# Patient Record
Sex: Female | Born: 1985
Health system: Southern US, Community
[De-identification: ages and names within clinical notes are randomized; demographics above are authoritative.]

## PROBLEM LIST (undated history)

## (undated) DIAGNOSIS — Z8051 Family history of malignant neoplasm of kidney: Secondary | ICD-10-CM

## (undated) DIAGNOSIS — Z808 Family history of malignant neoplasm of other organs or systems: Secondary | ICD-10-CM

## (undated) DIAGNOSIS — Z803 Family history of malignant neoplasm of breast: Secondary | ICD-10-CM

## (undated) DIAGNOSIS — C50919 Malignant neoplasm of unspecified site of unspecified female breast: Secondary | ICD-10-CM

## (undated) DIAGNOSIS — Z923 Personal history of irradiation: Secondary | ICD-10-CM

## (undated) HISTORY — DX: Family history of malignant neoplasm of breast: Z80.3

## (undated) HISTORY — DX: Family history of malignant neoplasm of kidney: Z80.51

## (undated) HISTORY — DX: Malignant neoplasm of unspecified site of unspecified female breast: C50.919

## (undated) HISTORY — PX: BREAST LUMPECTOMY: SHX2

## (undated) HISTORY — DX: Family history of malignant neoplasm of other organs or systems: Z80.8

---

## 2017-02-10 DIAGNOSIS — E559 Vitamin D deficiency, unspecified: Secondary | ICD-10-CM | POA: Insufficient documentation

## 2017-02-10 DIAGNOSIS — G4700 Insomnia, unspecified: Secondary | ICD-10-CM | POA: Insufficient documentation

## 2017-02-10 DIAGNOSIS — J3089 Other allergic rhinitis: Secondary | ICD-10-CM | POA: Insufficient documentation

## 2017-02-10 DIAGNOSIS — G43909 Migraine, unspecified, not intractable, without status migrainosus: Secondary | ICD-10-CM | POA: Insufficient documentation

## 2017-02-10 DIAGNOSIS — J302 Other seasonal allergic rhinitis: Secondary | ICD-10-CM | POA: Insufficient documentation

## 2017-03-08 DIAGNOSIS — N926 Irregular menstruation, unspecified: Secondary | ICD-10-CM | POA: Insufficient documentation

## 2017-06-20 DIAGNOSIS — Z923 Personal history of irradiation: Secondary | ICD-10-CM

## 2017-06-20 HISTORY — PX: BREAST LUMPECTOMY: SHX2

## 2017-06-20 HISTORY — DX: Personal history of irradiation: Z92.3

## 2017-10-03 DIAGNOSIS — E282 Polycystic ovarian syndrome: Secondary | ICD-10-CM | POA: Insufficient documentation

## 2018-07-30 ENCOUNTER — Telehealth: Payer: Self-pay

## 2018-07-30 ENCOUNTER — Telehealth: Payer: Self-pay | Admitting: Hematology and Oncology

## 2018-07-30 ENCOUNTER — Inpatient Hospital Stay: Payer: Commercial Managed Care - PPO | Attending: Hematology and Oncology | Admitting: Hematology and Oncology

## 2018-07-30 DIAGNOSIS — N951 Menopausal and female climacteric states: Secondary | ICD-10-CM | POA: Diagnosis not present

## 2018-07-30 DIAGNOSIS — Z1231 Encounter for screening mammogram for malignant neoplasm of breast: Secondary | ICD-10-CM

## 2018-07-30 DIAGNOSIS — Z170 Estrogen receptor positive status [ER+]: Secondary | ICD-10-CM

## 2018-07-30 DIAGNOSIS — Z79810 Long term (current) use of selective estrogen receptor modulators (SERMs): Secondary | ICD-10-CM | POA: Diagnosis not present

## 2018-07-30 DIAGNOSIS — Z923 Personal history of irradiation: Secondary | ICD-10-CM

## 2018-07-30 DIAGNOSIS — C50412 Malignant neoplasm of upper-outer quadrant of left female breast: Secondary | ICD-10-CM

## 2018-07-30 NOTE — Telephone Encounter (Signed)
Pt called to report manufacture of Tamoxifen.  Per patient, Tamoxifen package shows manufacture as "[employer]"   Pt confirmed she is taking Tamoxifen 10mg  BID.  Will forward to MD.    Letter for work successfully faxed per patient request.

## 2018-07-30 NOTE — Progress Notes (Signed)
[facility] CONSULT NOTE  Patient Care Team: Patient, No Pcp Per as PCP - General (General Practice)  CHIEF COMPLAINTS/PURPOSE OF CONSULTATION:  Newly diagnosed breast cancer in [place]  HISTORY OF PRESENTING ILLNESS:  Latiana Tomei 33 y.o. female is here because of recent diagnosis of left breast cancer in [place].  Patient has a family history of breast cancer and has been getting annual mammograms.  She missed her last year's mammogram and her parents wanted her to get another mammogram when she was visiting [place] in October.  On the mammogram there was an abnormality detected which led to a biopsy and she underwent a lumpectomy.  She did not have any lymph node involvement.  Oncotype DX was obtained which showed that she had low risk disease and she finished radiation and was started on tamoxifen.  She tells me that she has been experiencing lots of side effects from tamoxifen including profound hot flashes and emotional changes in addition to a feeling of chest pressure and fatigue and hair loss. She tells me that she has been taking 1 tablet twice a day but she does not know what dose of each tablet is.  This medication was obtained from [place].  I reviewed her records extensively and collaborated the history with the patient.  SUMMARY OF ONCOLOGIC HISTORY:   Malignant neoplasm of upper-outer quadrant of left breast in female, estrogen receptor positive ([place])   03/29/2018 Surgery    [facility] [place]: IDC grade 2, 1.8 x 1.6 x 1 cm, lymphovascular invasion present focally, 0/2 lymph nodes negative, ER positive, PR positive, HER-2 negative IHC 0, T1CN0 stage Ia pathologic stage    04/23/2018 Oncotype testing    Recurrent score: 16, distant recurrence of 9 years: 4%    05/03/2018 - 05/30/2018 Radiation Therapy    Adjuvant radiation therapy in [place] (4256 cGy/16 boost 10 Gy/4)    06/04/2018 -  Anti-estrogen oral therapy   Adjuvant tamoxifen 20 mg daily      MEDICAL HISTORY:  Other than breast cancer no other medical problems SURGICAL HISTORY: Left lumpectomy in [place] SOCIAL HISTORY: Married lives with her husband.  Does not have any children.  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 FAMILY HISTORY: Patient has family history of breast cancer.  ALLERGIES:  has no allergies on file.  MEDICATIONS: Tamoxifen REVIEW OF SYSTEMS:   Constitutional: Denies fevers, chills or abnormal night sweats Eyes: Denies blurriness of vision, double vision or watery eyes Ears, nose, mouth, throat, and face: Denies mucositis or sore throat Respiratory: Feeling of chest pressure Cardiovascular: Denies palpitation, chest discomfort or lower extremity swelling Gastrointestinal:  Denies nausea, heartburn or change in bowel habits Skin: Denies abnormal skin rashes Lymphatics: Denies new lymphadenopathy or easy bruising Neurological:Denies numbness, tingling or new weaknesses Behavioral/Psych: Mood is stable, no new changes  Breast: Intermittent mild left breast discomfort All other systems were reviewed with the patient and are negative.  PHYSICAL EXAMINATION: ECOG PERFORMANCE STATUS: 1 - Symptomatic but completely ambulatory  Vitals:   07/30/18 1239  BP: 132/80  Pulse: 81  Resp: 17  Temp: 98.6 F (37 C)  SpO2: 99%   Filed Weights   07/30/18 1239  Weight: 136 lb 1.6 oz (61.7 kg)    GENERAL:alert, no distress and comfortable SKIN: skin color, texture, turgor are normal, no rashes or significant lesions EYES: normal, conjunctiva are pink and non-injected, sclera clear OROPHARYNX:no exudate, no erythema and lips, buccal mucosa,  and tongue normal  NECK: supple, thyroid normal size, non-tender, without nodularity LYMPH:  no palpable lymphadenopathy in the cervical, axillary or inguinal LUNGS: clear to auscultation and percussion with normal breathing effort HEART: regular rate & rhythm and no  murmurs and no lower extremity edema ABDOMEN:abdomen soft, non-tender and normal bowel sounds Musculoskeletal:no cyanosis of digits and no clubbing  PSYCH: alert & oriented x 3 with fluent speech NEURO: no focal motor/sensory deficits  RADIOGRAPHIC STUDIES: I have personally reviewed the radiological reports and agreed with the findings in the report.  ASSESSMENT AND PLAN:  Malignant neoplasm of upper-outer quadrant of left breast in female, estrogen receptor positive ([place]) 03/29/2018:Left lumpectomy at [facility] [place]: IDC grade 2, 1.8 x 1.6 x 1 cm, lymphovascular invasion present focally, 0/2 lymph nodes negative, ER positive, PR positive, HER-2 negative IHC 0, T1CN0 stage Ia pathologic stage Oncotype DX score 16: Risk of recurrence 4% Adjuvant radiation therapy 05/01/2018-05/30/2018  Current treatment: Adjuvant tamoxifen started 06/03/2018 Tamoxifen toxicities: 1.  Hot flashes and night sweats 2.  Fatigue and hair loss 3.  Emotional mood swings 4.  Heaviness in the chest  Breast cancer surveillance: 1.  Breast exam 07/30/2018: Benign 2.  Mammogram will need to be done annually in October. 3.  Genetics consultation  I did not recommend routine CT scans for surveillance.  I believe that the chest heaviness that she is experiencing is stress related. I would like her to stop tamoxifen for 2 weeks and then resume it. I also wanted her to make sure that she is only taking 20 mg a day and not 20 mg twice a day. I also discussed with her that sometimes a manufacturer of tamoxifen makes a difference and that if she would like we can send her prescription for tamoxifen to her pharmacist.  Return to clinic in 3 months for survivorship care plan visit.  All questions were answered. The patient knows to call the clinic with any problems, questions or concerns.    Harriette Ohara, MD 07/30/18

## 2018-07-30 NOTE — Assessment & Plan Note (Signed)
03/29/2018:Left lumpectomy at [facility] [place]: IDC grade 2, 1.8 x 1.6 x 1 cm, lymphovascular invasion present focally, 0/2 lymph nodes negative, ER positive, PR positive, HER-2 negative IHC 0, T1CN0 stage Ia pathologic stage Oncotype DX score 16: Risk of recurrence 4% Adjuvant radiation therapy 05/01/2018-05/30/2018  Current treatment: Adjuvant tamoxifen started 06/03/2018 Tamoxifen toxicities:  Breast cancer surveillance: 1.  Breast exam 07/30/2018: Benign 2.  Mammogram will need to be done annually in October.  Return to clinic in 1 year for follow-up

## 2018-07-30 NOTE — Telephone Encounter (Signed)
Gave avs and calendar ° °

## 2018-09-03 ENCOUNTER — Other Ambulatory Visit: Payer: Self-pay | Admitting: *Deleted

## 2018-09-03 ENCOUNTER — Encounter: Payer: Self-pay | Admitting: Hematology and Oncology

## 2018-09-03 MED ORDER — TAMOXIFEN CITRATE 20 MG PO TABS: 20.0000 mg | ORAL_TABLET | Freq: Every day | ORAL | 0 refills | Status: DC

## 2018-09-03 NOTE — Telephone Encounter (Signed)
Received call from pt stated she has not heard from scheduling for her MRI Dr. Lindi Adie ordered.  According to referral prior Josem Kaufmann is incomplete.  Message sent to Gaspar Bidding to check on status of prior auth.  Pt also stated she needed a refill on her tamoxifen, pt states since being off of it for a month, she is feeling better and has less joint pain.  Prescription for tamoxifen sent to [facility] in [facility].  Pt notified and states understanding.

## 2018-10-05 ENCOUNTER — Telehealth: Payer: Self-pay | Admitting: Adult Health

## 2018-10-05 ENCOUNTER — Other Ambulatory Visit: Payer: Self-pay | Admitting: *Deleted

## 2018-10-05 ENCOUNTER — Encounter: Payer: Self-pay | Admitting: Adult Health

## 2018-10-05 DIAGNOSIS — C50412 Malignant neoplasm of upper-outer quadrant of left female breast: Secondary | ICD-10-CM

## 2018-10-05 DIAGNOSIS — Z170 Estrogen receptor positive status [ER+]: Principal | ICD-10-CM

## 2018-10-05 NOTE — Telephone Encounter (Signed)
Rescheduled SCP appt and changed to Webex per sch msg. Patient will be contacted

## 2018-10-11 ENCOUNTER — Telehealth: Payer: Self-pay | Admitting: Hematology and Oncology

## 2018-10-11 ENCOUNTER — Other Ambulatory Visit: Payer: Self-pay | Admitting: Adult Health

## 2018-10-11 ENCOUNTER — Telehealth: Payer: Self-pay | Admitting: Adult Health

## 2018-10-11 ENCOUNTER — Encounter: Payer: Self-pay | Admitting: *Deleted

## 2018-10-11 NOTE — Telephone Encounter (Signed)
Called and spoke with patient about her mammogram and MRI.  She understands why radiology needs a mammogram to help with interpreting the MRI.  She has mammogram scheduled for end of April, 2020 at the breast center.  She would like to delay MRI to October, to have a six month gap, which is reasonable. I will adjust orders accordingly.    Wilber Bihari, NP

## 2018-10-11 NOTE — Telephone Encounter (Signed)
I called and left a message for the patient to call me back.  I received a message that the patient wants to discuss with me about her mammogram and MRI.  We are happy to discuss these whenever she calls [place] back. thank you

## 2018-10-17 ENCOUNTER — Encounter: Payer: Self-pay | Admitting: Adult Health

## 2018-10-17 NOTE — Telephone Encounter (Signed)
Message left for collaborative in reference to cancellation request.

## 2018-10-17 NOTE — Telephone Encounter (Signed)
Forwarding to [employer].

## 2018-10-19 ENCOUNTER — Other Ambulatory Visit: Payer: Self-pay

## 2018-10-19 ENCOUNTER — Other Ambulatory Visit: Payer: Self-pay | Admitting: Hematology and Oncology

## 2018-10-19 ENCOUNTER — Ambulatory Visit
Admission: RE | Admit: 2018-10-19 | Discharge: 2018-10-19 | Disposition: A | Discharge status: Home / Self Care | Payer: Commercial Managed Care - PPO | Source: Ambulatory Visit | Attending: Hematology and Oncology | Admitting: Hematology and Oncology

## 2018-10-19 DIAGNOSIS — Z170 Estrogen receptor positive status [ER+]: Principal | ICD-10-CM

## 2018-10-19 DIAGNOSIS — R921 Mammographic calcification found on diagnostic imaging of breast: Secondary | ICD-10-CM

## 2018-10-19 DIAGNOSIS — C50412 Malignant neoplasm of upper-outer quadrant of left female breast: Secondary | ICD-10-CM

## 2018-10-19 HISTORY — DX: Personal history of irradiation: Z92.3

## 2018-10-19 IMAGING — MG DIGITAL DIAGNOSTIC BILATERAL MAMMOGRAM WITH TOMO AND CAD
8 of 12 series · 8 of 28 positions shown · non-contrast
Comparison: None

CLINICAL DATA: Patient with history of left breast lumpectomy [7O[place]. No prior or outside images available on
today's exam.

EXAM:
DIGITAL DIAGNOSTIC BILATERAL MAMMOGRAM WITH CAD AND TOMO

[R ML (1 of 2)]
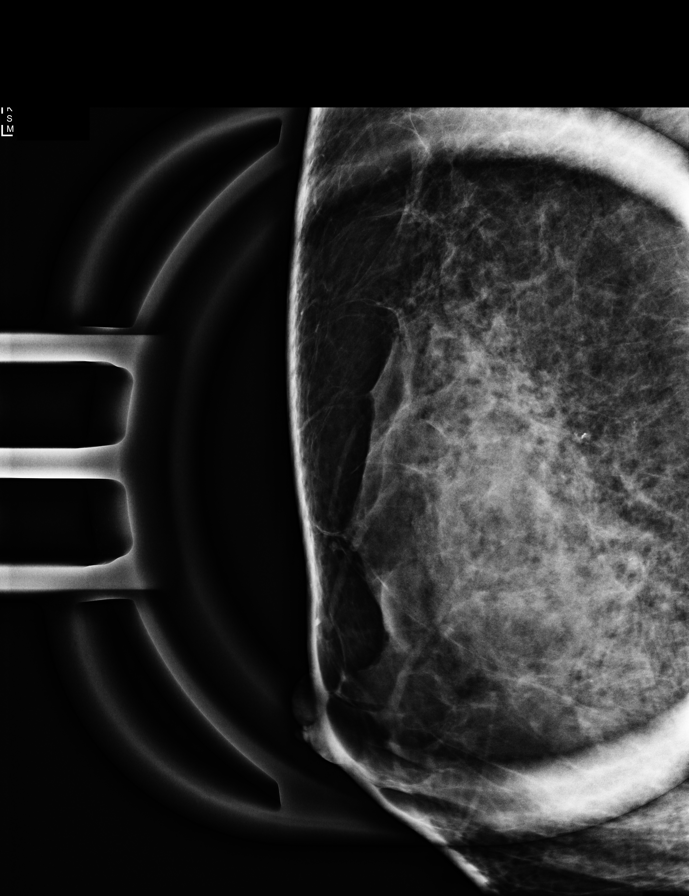

[L CC]
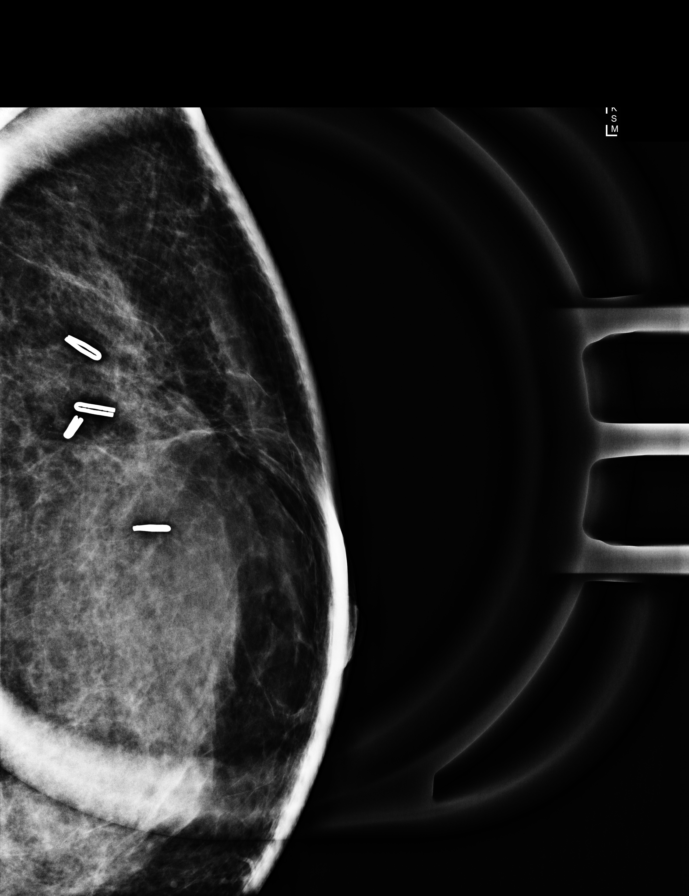

[R ML (2 of 2)]
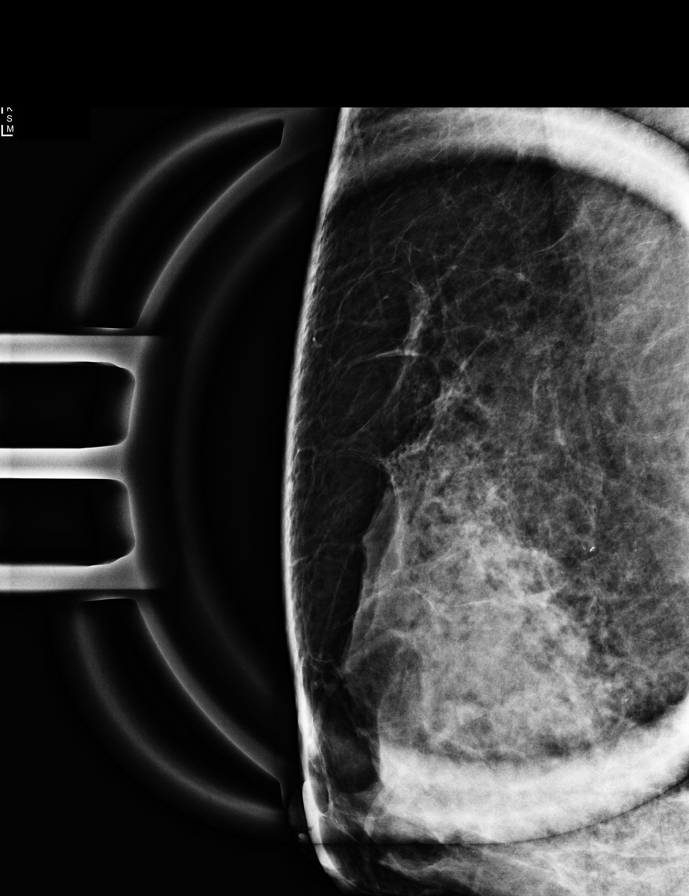

[R CC]
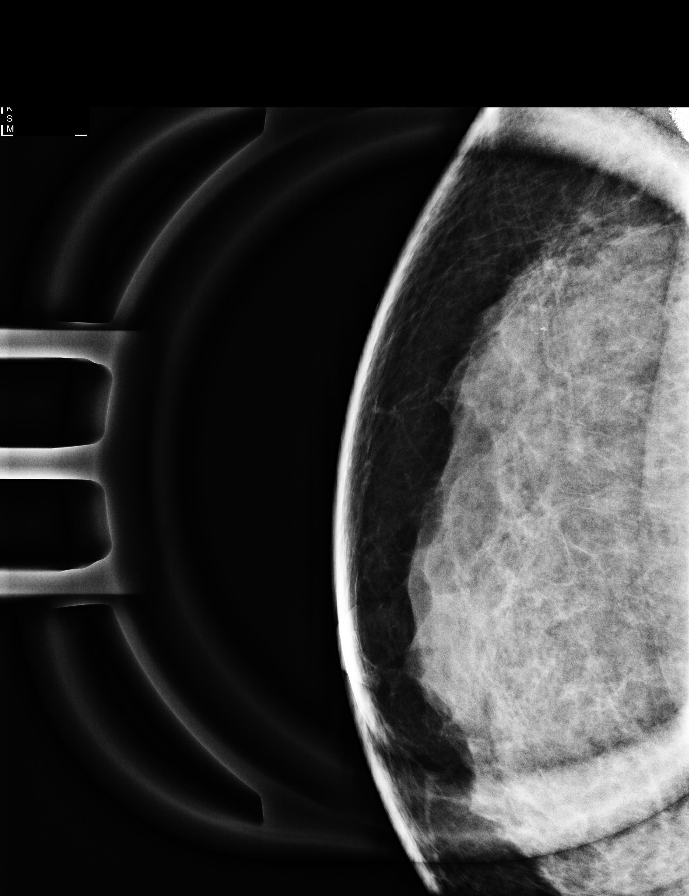

[R MLO synth-2D]
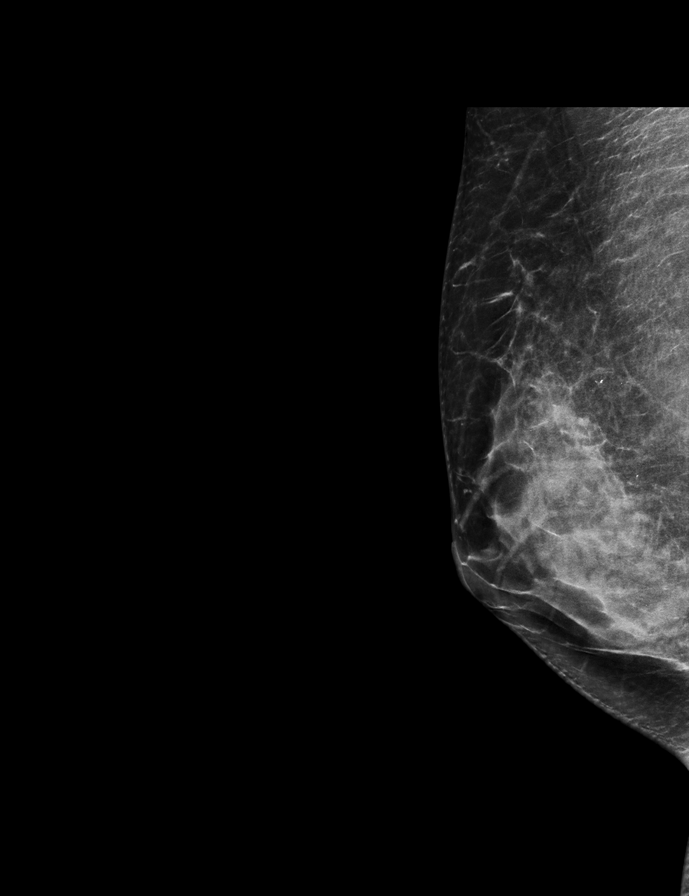

[L CC synth-2D]
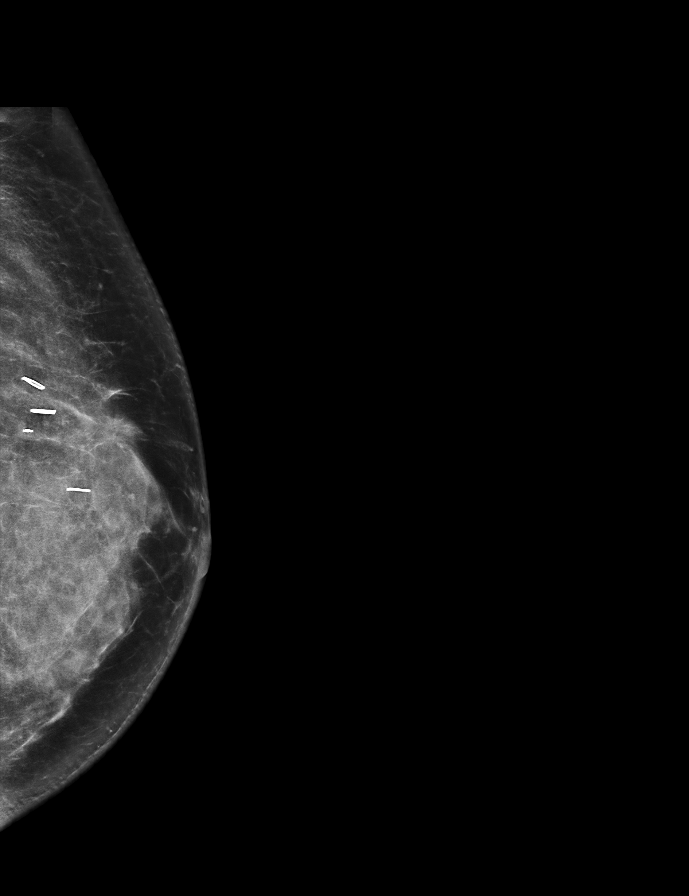

[R CC synth-2D]
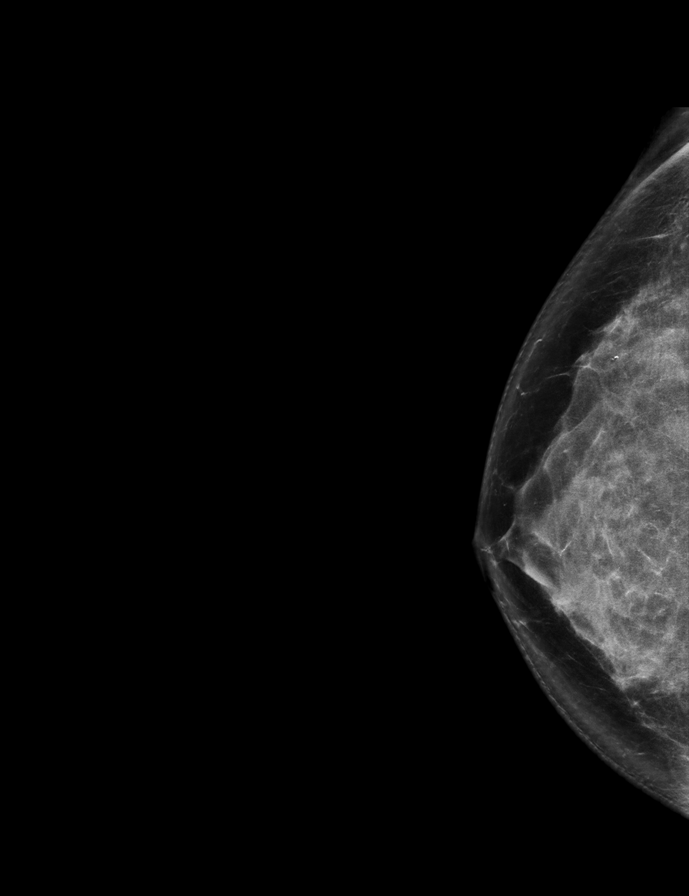

[L MLO synth-2D]
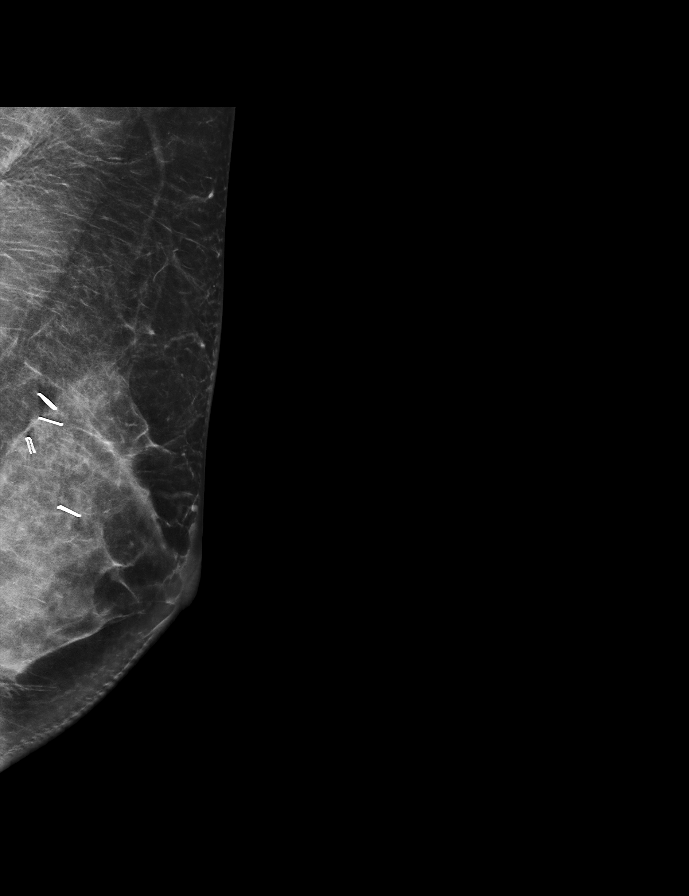

[8 of 28 positions shown; findings below may reference images not displayed]

ACR Breast Density Category d: The breast tissue is extremely dense,
which lowers the sensitivity of mammography.
FINDINGS: Post lumpectomy changes identified within the upper-outer left
breast. Within the upper-outer right breast there are likely early
dystrophic calcifications. No additional masses, calcifications or
nonsurgical distortion identified within either breast.

Mammographic images were processed with CAD.
IMPRESSION: 1. Postlumpectomy changes left breast.
2. Probably benign right breast calcifications.

RECOMMENDATION:
Bilateral diagnostic mammography with right breast magnification
views in 6 months to ensure stability of probably benign right
breast calcifications and to reassess the left breast lumpectomy
site as no priors were available for current exam. Current exam
serves as baseline.

I have discussed the findings and recommendations with the patient.
Results were also provided in writing at the conclusion of the
visit. If applicable, a reminder letter will be sent to the patient
regarding the next appointment.

BI-RADS CATEGORY  3: Probably benign.

## 2018-10-26 ENCOUNTER — Ambulatory Visit (HOSPITAL_COMMUNITY): Payer: Commercial Managed Care - PPO

## 2018-10-30 ENCOUNTER — Telehealth: Payer: Self-pay | Admitting: Adult Health

## 2018-10-30 NOTE — Telephone Encounter (Signed)
Spoke with pt and she agrees to convert office visit for 11/05/18 into a Webex mtg. Emailed step-by-step instructions how to download and access [employer].  Pt will call with any questions.

## 2018-11-05 ENCOUNTER — Encounter: Payer: Self-pay | Admitting: Adult Health

## 2018-11-05 ENCOUNTER — Inpatient Hospital Stay: Payer: Commercial Managed Care - PPO | Attending: Adult Health | Admitting: Adult Health

## 2018-11-05 DIAGNOSIS — Z1231 Encounter for screening mammogram for malignant neoplasm of breast: Secondary | ICD-10-CM

## 2018-11-05 DIAGNOSIS — Z170 Estrogen receptor positive status [ER+]: Secondary | ICD-10-CM

## 2018-11-05 DIAGNOSIS — Z79810 Long term (current) use of selective estrogen receptor modulators (SERMs): Secondary | ICD-10-CM

## 2018-11-05 DIAGNOSIS — C50412 Malignant neoplasm of upper-outer quadrant of left female breast: Secondary | ICD-10-CM

## 2018-11-05 DIAGNOSIS — Z923 Personal history of irradiation: Secondary | ICD-10-CM | POA: Diagnosis not present

## 2018-11-05 NOTE — Progress Notes (Signed)
SURVIVORSHIP VIRTUAL VISIT:  I connected with Andrea Beltran on 11/05/18 at  9:00 AM EDT by [facility] and verified that I am speaking with the correct person using two identifiers.   I discussed the limitations, risks, security and privacy concerns of performing an evaluation and management service by [facility] and the availability of in person appointments. I also discussed with the patient that there may be a patient responsible charge related to this service. The patient expressed understanding and agreed to proceed.   BRIEF ONCOLOGIC HISTORY:    Malignant neoplasm of upper-outer quadrant of left breast in female, estrogen receptor positive ([place])   03/29/2018 Surgery    Left lumpectomy at [facility] [place]: IDC grade 2, 1.8 x 1.6 x 1 cm, lymphovascular invasion present focally, 0/2 lymph nodes negative, ER positive, PR positive, HER-2 negative IHC 0, T1CN0 stage Ia pathologic stage    04/23/2018 Oncotype testing    Recurrent score: 16, distant recurrence of 9 years: 4%    05/03/2018 - 05/30/2018 Radiation Therapy    Adjuvant radiation therapy in [place] (4256 cGy/16 boost 10 Gy/4)    06/04/2018 -  Anti-estrogen oral therapy    Adjuvant tamoxifen 20 mg daily     INTERVAL HISTORY:  Andrea Beltran to review her survivorship care plan detailing her treatment course for breast cancer, as well as monitoring long-term side effects of that treatment, education regarding health maintenance, screening, and overall wellness and health promotion.     Overall, [name] reports feeling quite well.  She is taking Tamoxifen daily, but notes as the weather is getting warmer she is challenged with hot flashes.  These are interfering with her sleep and she is taking Lunesta at bedtime to help with her sleep.  She also reports an occasional chest pressure that happens once or twice a day and is brief in nature.  It is sometimes associated with a shortness of breath.  She denies any cough,  palpitations, dizziness, pleuritic chest pain with this.  She doesn't feel like her heart is racing.    [place]ina notes that her LMP was on 3/17.  She says she took a pregnancy test on 3/17 and it was negative.  She says she knows she shouldn't get pregnant on Tamoxifen, and she is not on birth control, but she and her husband are using protection.  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  Andrea Beltran is a patient of Dr. Terri Piedra at [facility].  She is returning to see them in 04/2019 for transvaginal ultrasound to monitor the uterus lining.    Tonji noted some pain in her breast after radiation that has slowly resolved.  She notes that she took some vitamin b for this and it helped.    [name] has some fatigue from time to time and is working on her diet and exercise.  She notes that she underwent mammogram in 10/2018.  She was recommended repeat in 04/2019 to follow probably benign right breast calcifications.  She has breast density category D and will undergo breast MRI at that time as well.     REVIEW OF SYSTEMS:  Review of Systems  Constitutional: Positive for fatigue. Negative for appetite change, chills, fever and unexpected weight change.  HENT:   Negative for hearing loss, lump/mass, mouth sores and trouble swallowing.   Eyes: Negative for eye problems and icterus.  Respiratory: Positive for chest tightness. Negative for cough and  shortness of breath.   Cardiovascular: Negative for chest pain, leg swelling and palpitations.  Gastrointestinal: Negative for abdominal distention, abdominal pain, constipation, diarrhea, nausea and vomiting.  Endocrine: Positive for hot flashes.  Musculoskeletal: Negative for arthralgias.  Skin: Negative for itching and rash.  Neurological: Negative for dizziness, extremity weakness, headaches, light-headedness and numbness.  Hematological: Negative for adenopathy. Does not bruise/bleed easily.   Psychiatric/Behavioral: Negative for depression. The patient is not nervous/anxious.   Breast: Denies any new nodularity, masses, tenderness, nipple changes, or nipple discharge.      ONCOLOGY TREATMENT TEAM:  1. Surgeon: In [place] 2. Medical Oncologist: Dr. Lindi Adie  3. Radiation Oncologist: In [place]    PAST MEDICAL/SURGICAL HISTORY:  Past Medical History:  Diagnosis Date  . Breast cancer ([place])   . Personal history of radiation therapy    Past Surgical History:  Procedure Laterality Date  . BREAST LUMPECTOMY       ALLERGIES:  No Known Allergies   CURRENT MEDICATIONS:  Outpatient Encounter Medications as of 11/05/2018  Medication Sig  . eszopiclone (LUNESTA) 2 MG TABS tablet Take 2 mg by mouth at bedtime as needed. Take immediately before bedtime  . fluticasone (FLONASE) 50 MCG/ACT nasal spray Place 1 spray into the nose daily.  . tamoxifen (NOLVADEX) 20 MG tablet Take 1 tablet (20 mg total) by mouth daily.   No facility-administered encounter medications on file as of 11/05/2018.      ONCOLOGIC FAMILY HISTORY:  Family History  Problem Relation Age of Onset  . Breast cancer Maternal Grandmother      GENETIC COUNSELING/TESTING: Referred today  SOCIAL HISTORY:  Social History   Socioeconomic History  . Marital status: Married    Spouse name: Not on file  . Number of children: Not on file  . Years of education: Not on file  . Highest education level: Not on file  Occupational History  . Not on file  Social Needs  . Financial resource strain: Not on file  . Food insecurity:    Worry: Not on file    Inability: Not on file  . Transportation needs:    Medical: Not on file    Non-medical: Not on file  Tobacco Use  . Smoking status: Never Smoker  . Smokeless tobacco: Never Used  Substance and Sexual Activity  . Alcohol use: Not on file  . Drug use: Not on file  . Sexual activity: Not on file  Lifestyle  . Physical activity:    Days per week: Not on  file    Minutes per session: Not on file  . Stress: Not on file  Relationships  . Social connections:    Talks on phone: Not on file    Gets together: Not on file    Attends religious service: Not on file    Active member of club or organization: Not on file    Attends meetings of clubs or organizations: Not on file    Relationship status: Not on file  . Intimate partner violence:    Fear of current or ex partner: Not on file    Emotionally abused: Not on file    Physically abused: Not on file    Forced sexual activity: Not on file  Other Topics Concern  . Not on file  Social History Narrative  . Not on file     OBSERVATIONS/OBJECTIVE:   Patient appears well.  No apparent distress.  Skin is without rash or lesion.  Breathing is non labored.  Her  mood and behavior are normal and neurologically she is focally intact.    LABORATORY DATA:  None for this visit.  DIAGNOSTIC IMAGING:      ASSESSMENT AND PLAN:  Ms.. Beltran is a pleasant 33 y.o. female with Stage IA left breast invasive ductal carcinoma, ER+/PR+/HER2-, diagnosed in 03/2018, treated with lumpectomy, adjuvant radiation therapy, and anti-estrogen therapy with Tamoxifen beginning in 12/20019.  She presents to the Survivorship Clinic for our initial meeting and routine follow-up post-completion of treatment for breast cancer.    1. Stage IA left breast cancer:  Ms. Lajeunesse is continuing to recover from definitive treatment for breast cancer. She will follow-up with her medical oncologist, Dr. Lindi Adie in 01/2018 with history and physical exam per surveillance protocol.  She will continue her anti-estrogen therapy with Tamoxifen. Thus far, she is tolerating the Tamoxifen well, with minimal side effects (see number 2). She underwent mammogram in 10/2018 and it demonstrated breast density category D.  Due to her breast density and increased risk of developing another breast cancer, she will undergo annual breast MRI as well every  November.  We also reviewed that she will need a genetics referral, which I placed today.    Today, a comprehensive survivorship care plan and treatment summary was reviewed with the patient today detailing her breast cancer diagnosis, treatment course, potential late/long-term effects of treatment, appropriate follow-up care with recommendations for the future, and patient education resources.  A copy of this summary, along with a letter will be sent to the patient's primary care provider via mail/fax/In Basket message after today's visit.    2.  Hot flashes: She and I reviewed non pharmacologic mechanisms to help relieve these hot flashes, such as avoiding hot/spicy foods, caffeine, hot beverages, ETOH.  We also reviewed that the time of day that she takes the Tamoxifen may impact whether or not she experiences hot flashes and their severity.  Right now, she is taking Lunesta for her hot flashes.  We will follow this, if they continue we can consider adding Effexor or Gabapentin to avoid her having to take a sleeping medication.    3.  Birth control: Bri[place] and I reviewed that she should be using some sort of birth control such as having an IUD placed or condoms while taking Tamoxifen.  She understands this and notes she sees her gyn regularly.    4. Chest tightness: This is brief and intermittent, question if it is anxiety related due to stress from work and breast cancer diagnosis.  She does not have a PCP in the area.  I reached out to Dr. Juleen China to see if she can get patient in as new patient and evaluate.  If not, or if there is a delay, then I will refer Srinika to cardiology for evaluation, to rule out arrhythmia.    5. Cancer screening:  Due to Ms. Cuny's history and her age, she should receive screening for skin cancers, and gynecologic cancers.  The information and recommendations are listed on the patient's comprehensive care plan/treatment summary and were reviewed in detail with the  patient.    6. Health maintenance and wellness promotion: Ms. Schwan was encouraged to consume 5-7 servings of fruits and vegetables per day. We reviewed the "Nutrition Rainbow" handout.  She was also encouraged to engage in moderate to vigorous exercise for 30 minutes per day most days of the week. We discussed the [employer] fitness program, which is designed for cancer survivors to help them become more  physically fit after cancer treatments.  She was instructed to limit her alcohol consumption and continue to abstain from tobacco use.    7. Support services/counseling: It is not uncommon for this period of the patient's cancer care trajectory to be one of many emotions and stressors.  We discussed how this can be increasingly difficult during the times of quarantine and social distancing due to the COVID-19 pandemic.   She was given information regarding our available services and encouraged to contact me with any questions or for help enrolling in any of our support group/programs.    Follow up instructions:    -Return to cancer center in 01/2019 for f/u with Dr. Lindi Adie -Establish with PCP -Breast MRI in 04/2019  -Mammogram due in 04/2019 -LTS visit with me in 07/2018 virtually -Breast exam with GYN in 07/2018  -She is welcome to return back to the Survivorship Clinic at any time; no additional follow-up needed at this time.  -Consider referral back to survivorship as a long-term survivor for continued surveillance  The patient was provided an opportunity to ask questions and all were answered. The patient agreed with the plan and demonstrated an understanding of the instructions.   The patient was advised to call back or seek an in-person evaluation if the symptoms worsen or if the condition fails to improve as anticipated.   I provided 50 minutes of face-to-face video visit time during this encounter, and > 50% was spent counseling as documented under my assessment & plan.  Scot Dock, NP

## 2018-11-06 ENCOUNTER — Telehealth: Payer: Self-pay

## 2018-11-06 ENCOUNTER — Encounter: Payer: Commercial Managed Care - PPO | Admitting: Adult Health

## 2018-11-06 NOTE — Telephone Encounter (Signed)
-----   Message from Briscoe Deutscher, DO sent at 11/05/2018  3:21 PM EDT ----- Sounds good.  ----- Message ----- From: Francella Solian, CMA Sent: 11/05/2018   2:05 PM EDT To: Briscoe Deutscher, DO  Do you want me to get app made in office so we can do EKG if needed?  ----- Message ----- From: Briscoe Deutscher, DO Sent: 11/05/2018  10:41 AM EDT To: [place]  Team: Please call the patient to offer an appointment ASAP.   Mendel Ryder, Thanks for reaching out! We will take care of her.   Hope that you are staying safe and sane! Danae Mix ----- Message ----- From: Gardenia Phlegm, NP Sent: 11/05/2018   9:41 AM EDT To: Briscoe Deutscher, DO  Tito Dine,  I hope you are well.  I had a Survivorship Care Plan Visit with Andrea Beltran today.  She is a very pleasant 33 year old woman who is healthy other than her breast cancer.   She does not have a PCP in the area, and has only been seeing her NP at her work.  She needs to establish with one.  I wanted to reach out because [name] has been having some occasional chest pressure with some associated shortness of breath.  While I think this is likely anxiety, I was hoping to get her in with a PCP to establish, and to perhaps put a holter monitor on her to avoid sending her to cardiology.  Is there any way you would be able to get her in over the next couple of weeks?  If you see her and determine this is anxiety related, typically with tamoxifen and hot flashes/anxiety effexor is our number one pick, but am happy to write for this if needed.  I hope you and your family is well!    Thanks so much,  [employer]

## 2018-11-06 NOTE — Telephone Encounter (Signed)
Called patient to make new pt app in office. L/m to call back. Can you try to get her as well. Will needs as soon as possible per wallace message.

## 2018-11-07 ENCOUNTER — Telehealth: Payer: Self-pay | Admitting: Hematology and Oncology

## 2018-11-07 NOTE — Telephone Encounter (Signed)
Scheduled genetics appt per sch msg. Called and left msg for patient

## 2018-11-08 NOTE — Progress Notes (Signed)
Virtual Visit via Video   Due to the COVID-19 pandemic, this visit was completed with telemedicine (audio/video) technology to reduce patient and provider exposure as well as to preserve personal protective equipment.  I connected with Andrea Beltran by a video enabled telemedicine application and verified that I am speaking with the correct person using two identifiers. Location patient: Home Location provider: [facility], Office Persons participating in the virtual visit: Andrea Beltran, Andrea Deutscher, DO Andrea Beltran, CMA acting as scribe for Dr. Briscoe Beltran.   I discussed the limitations of evaluation and management by telemedicine and the availability of in person appointments. The patient expressed understanding and agreed to proceed.  Care Team   Patient Care Team: Andrea Deutscher, DO as PCP - General (Family Medicine) Nicholas Lose, MD as Consulting Physician (Hematology and Oncology)  Subjective:   HPI: Patient presents for NEW PATIENT appointment.   She is taking Tamoxifen daily, but notes as the weather is getting warmer she is challenged with hot flashes.  These are interfering with her sleep and she is taking Lunesta at bedtime to help with her sleep.  She also reports an occasional chest pressure that happens once or twice a day and is brief in nature.  It is sometimes associated with a shortness of breath.  She denies any cough, palpitations, dizziness, pleuritic chest pain with this.  She doesn't feel like her heart is racing.    Andrea Beltran notes that her LMP was on 3/17.  She says she took a pregnancy test on 3/17 and it was negative.  She says she knows she shouldn't get pregnant on Tamoxifen, and she is not on birth control, but she and her husband are using protection. 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  Andrea Beltran is a patient of Dr. Terri Piedra at [facility].  She is returning to see them in 04/2019  for transvaginal ultrasound to monitor the uterus lining.    Andrea Beltran noted some pain in her breast after radiation that has slowly resolved.  She notes that she took some vitamin b for this and it helped.    Andrea Beltran has some fatigue from time to time and is working on her diet and exercise.  She notes that she underwent mammogram in 10/2018.  She was recommended repeat in 04/2019 to follow probably benign right breast calcifications.  She has breast density category D and will undergo breast MRI at that time as well.    Patient states that she has had increased "tightnes" in chest. She denies any chest pain just pressure when she tries to take a deep breath. She states that it is worse when she lying flat. She does have issues with allergies. She is taking Flonase. She denies any issues with reflux and heart burn. When she has they symptoms they last for a while and will go away. She has noticed that when she had stopped the tamoxifen it was better. Symptoms were bad when she started back on tamoxifen but as she gets adjusted to it symptoms have improved.   Sleep: she has had increased problems with sleeping. She has been on Lunesta for a wile. It does help with going to sleep but does not help her stay asleep.    She does intermittent diet and exercises at minimum 3 times a week.   Patient Active Problem List   Diagnosis Date Noted  . Family history of breast cancer   .  Family history of kidney cancer   . Family history of brain cancer   . Hot flashes due to tamoxifen 11/09/2018  . Malignant neoplasm of upper-outer quadrant of left breast in female, estrogen receptor positive ([place]) 07/30/2018  . Seasonal allergic rhinitis 02/10/2017  . Insomnia 02/10/2017  . Vitamin D deficiency 02/10/2017    Social History   Tobacco Use  . Smoking status: Never Smoker  . Smokeless tobacco: Never Used  Substance Use Topics  . Alcohol use: Not on file   Current Outpatient Medications:  .  eszopiclone  (LUNESTA) 2 MG TABS tablet, Take 2 mg by mouth at bedtime as needed. Take immediately before bedtime, Disp: , Rfl:  .  fluticasone (FLONASE) 50 MCG/ACT nasal spray, Place 1 spray into the nose daily., Disp: , Rfl:  .  tamoxifen (NOLVADEX) 20 MG tablet, Take 1 tablet (20 mg total) by mouth daily., Disp: 90 tablet, Rfl: 0  Allergies  Allergen Reactions  . Cat Hair Extract Other (See Comments)    sneezing sneezing   . Pollen Extract Other (See Comments)    Sneezing     Objective:   VITALS: Per patient if applicable, see vitals. GENERAL: Alert and in no acute distress. CARDIOPULMONARY: No increased WOB. Speaking in clear sentences.  PSYCH: Pleasant and cooperative. Speech normal rate and rhythm. Affect is appropriate. Insight and judgement are appropriate. Attention is focused, linear, and appropriate.  NEURO: Oriented as arrived to appointment on time with no prompting.   Assessment and Plan:   Andrea Beltran was seen today for establish care.  Diagnoses and all orders for this visit:  Atypical chest pain Comments: Labs, EKG, CXR in office this week. Tamxifen has increased pain per patient. Will see if Effexor helps.  Vitamin D deficiency  Primary insomnia  Seasonal allergic rhinitis due to pollen, cats  Malignant neoplasm of upper-outer quadrant of left breast in female, estrogen receptor positive ([place])  Hot flashes due to tamoxifen Comments: Taking calcium and vitamin D. Orders: -     venlafaxine XR (EFFEXOR XR) 37.5 MG 24 hr capsule; Take 1 capsule (37.5 mg total) by mouth daily with breakfast. (Patient not taking: Reported on 11/22/2018)    . COVID-19 Education: The signs and symptoms of COVID-19 were discussed with the patient and how to seek care for testing if needed. The importance of social distancing was discussed today. . Reviewed expectations re: course of current medical issues. . Discussed self-management of symptoms. . Outlined signs and symptoms indicating need  for more acute intervention. . Patient verbalized understanding and all questions were answered. Marland Kitchen Health Maintenance issues including appropriate healthy diet, exercise, and smoking avoidance were discussed with patient. . See orders for this visit as documented in the electronic medical record.  Andrea Deutscher, DO  Records requested if needed. Time spent: 30 minutes, of which >50% was spent in obtaining information about her symptoms, reviewing her previous labs, evaluations, and treatments, counseling her about her condition (please see the discussed topics above), and developing a plan to further investigate it; she had a number of questions which I addressed.

## 2018-11-09 ENCOUNTER — Encounter: Payer: Self-pay | Admitting: Family Medicine

## 2018-11-09 ENCOUNTER — Ambulatory Visit (INDEPENDENT_AMBULATORY_CARE_PROVIDER_SITE_OTHER): Payer: Commercial Managed Care - PPO | Admitting: Family Medicine

## 2018-11-09 ENCOUNTER — Other Ambulatory Visit: Payer: Self-pay

## 2018-11-09 VITALS — Ht 66.0 in | Wt 136.0 lb

## 2018-11-09 DIAGNOSIS — Z170 Estrogen receptor positive status [ER+]: Secondary | ICD-10-CM

## 2018-11-09 DIAGNOSIS — F5101 Primary insomnia: Secondary | ICD-10-CM | POA: Diagnosis not present

## 2018-11-09 DIAGNOSIS — C50412 Malignant neoplasm of upper-outer quadrant of left female breast: Secondary | ICD-10-CM

## 2018-11-09 DIAGNOSIS — R0789 Other chest pain: Secondary | ICD-10-CM | POA: Diagnosis not present

## 2018-11-09 DIAGNOSIS — R232 Flushing: Secondary | ICD-10-CM | POA: Insufficient documentation

## 2018-11-09 DIAGNOSIS — E559 Vitamin D deficiency, unspecified: Secondary | ICD-10-CM | POA: Diagnosis not present

## 2018-11-09 DIAGNOSIS — J301 Allergic rhinitis due to pollen: Secondary | ICD-10-CM | POA: Diagnosis not present

## 2018-11-09 DIAGNOSIS — T451X5A Adverse effect of antineoplastic and immunosuppressive drugs, initial encounter: Secondary | ICD-10-CM

## 2018-11-09 MED ORDER — VENLAFAXINE HCL ER 37.5 MG PO CP24: 37.5000 mg | ORAL_CAPSULE | Freq: Every day | ORAL | 0 refills | Status: DC

## 2018-11-14 ENCOUNTER — Telehealth: Payer: Self-pay | Admitting: Licensed Clinical Social Worker

## 2018-11-14 NOTE — Telephone Encounter (Signed)
Called patient regarding upcoming Webex appointment, patient is notified and e-mail has been sent. °

## 2018-11-15 ENCOUNTER — Ambulatory Visit: Payer: Commercial Managed Care - PPO | Admitting: Family Medicine

## 2018-11-19 ENCOUNTER — Inpatient Hospital Stay: Payer: Commercial Managed Care - PPO | Attending: Adult Health | Admitting: Licensed Clinical Social Worker

## 2018-11-19 DIAGNOSIS — C50412 Malignant neoplasm of upper-outer quadrant of left female breast: Secondary | ICD-10-CM

## 2018-11-19 DIAGNOSIS — Z1379 Encounter for other screening for genetic and chromosomal anomalies: Secondary | ICD-10-CM

## 2018-11-19 DIAGNOSIS — Z170 Estrogen receptor positive status [ER+]: Secondary | ICD-10-CM

## 2018-11-19 DIAGNOSIS — Z803 Family history of malignant neoplasm of breast: Secondary | ICD-10-CM | POA: Diagnosis not present

## 2018-11-19 DIAGNOSIS — Z8051 Family history of malignant neoplasm of kidney: Secondary | ICD-10-CM

## 2018-11-19 DIAGNOSIS — Z808 Family history of malignant neoplasm of other organs or systems: Secondary | ICD-10-CM

## 2018-11-20 ENCOUNTER — Ambulatory Visit: Payer: Commercial Managed Care - PPO | Admitting: Family Medicine

## 2018-11-20 ENCOUNTER — Encounter: Payer: Self-pay | Admitting: Licensed Clinical Social Worker

## 2018-11-20 DIAGNOSIS — Z803 Family history of malignant neoplasm of breast: Secondary | ICD-10-CM | POA: Insufficient documentation

## 2018-11-20 DIAGNOSIS — Z8051 Family history of malignant neoplasm of kidney: Secondary | ICD-10-CM | POA: Insufficient documentation

## 2018-11-20 DIAGNOSIS — Z808 Family history of malignant neoplasm of other organs or systems: Secondary | ICD-10-CM | POA: Insufficient documentation

## 2018-11-20 NOTE — Progress Notes (Signed)
REFERRING PROVIDER: Nicholas Lose, MD [place], [place] [address]  PRIMARY PROVIDER:  Briscoe Deutscher, DO  PRIMARY REASON FOR VISIT:  1. Malignant neoplasm of upper-outer quadrant of left breast in female, estrogen receptor positive ([place])   2. Family history of breast cancer   3. Family history of kidney cancer   4. Family history of brain cancer    I connected with Andrea Beltran on 11/19/2018 at 4:00 PM EDT by Webex and verified that I am speaking with the correct person using two identifiers.    Patient location: home Provider location: clinic  HISTORY OF PRESENT ILLNESS:   Andrea Beltran, a 33 y.o. female, was seen for a [facility] cancer genetics consultation at the request of Dr. Lindi Adie due to a personal and family history of breast cancer.  Andrea Beltran presents to clinic today to discuss the possibility of a hereditary predisposition to cancer, genetic testing, and to further clarify her future cancer risks, as well as potential cancer risks for family members.   In 2019, at the age of 38, Andrea Beltran was diagnosed with invasive ductal carcinoma of the left breast in [place], ER+ PR+ Her2-. This was treated with lumpectomy and radiation, and she takes tamoxifen.   CANCER HISTORY:    Malignant neoplasm of upper-outer quadrant of left breast in female, estrogen receptor positive ([place])   03/29/2018 Surgery    Left lumpectomy at [facility] [place]: IDC grade 2, 1.8 x 1.6 x 1 cm, lymphovascular invasion present focally, 0/2 lymph nodes negative, ER positive, PR positive, HER-2 negative IHC 0, T1CN0 stage Ia pathologic stage    04/23/2018 Oncotype testing    Recurrent score: 16, distant recurrence of 9 years: 4%    05/03/2018 - 05/30/2018 Radiation Therapy    Adjuvant radiation therapy in [place] (4256 cGy/16 boost 10 Gy/4)    06/04/2018 -  Anti-estrogen oral therapy    Adjuvant tamoxifen 20 mg daily     RISK FACTORS:  Menarche was at age 45.   First live birth at age no children.  OCP use for approximately  1 month Ovaries intact: Yes  Hysterectomy: No  Menopausal status: Premenopausal  HRT use: 0 years. Colonoscopy: No not examined. Mammogram within the last year: Yes Up to date with pelvic exams: Yes Any excessive radiation exposure in the past: No  Past Medical History:  Diagnosis Date  . Breast cancer ([place])   . Family history of brain cancer   . Family history of breast cancer   . Family history of kidney cancer   . Personal history of radiation therapy     Past Surgical History:  Procedure Laterality Date  . BREAST LUMPECTOMY      Social History   Socioeconomic History  . Marital status: Married    Spouse name: Not on file  . Number of children: Not on file  . Years of education: Not on file  . Highest education level: Not on file  Occupational History  . Not on file  Social Needs  . Financial resource strain: Not on file  . Food insecurity:    Worry: Not on file    Inability: Not on file  . Transportation needs:    Medical: Not on file    Non-medical: Not on file  Tobacco Use  . Smoking status: Never Smoker  . Smokeless tobacco: Never Used  Substance and Sexual Activity  . Alcohol use: Not on file  . Drug use: Not on  file  . Sexual activity: Not on file  Lifestyle  . Physical activity:    Days per week: Not on file    Minutes per session: Not on file  . Stress: Not on file  Relationships  . Social connections:    Talks on phone: Not on file    Gets together: Not on file    Attends religious service: Not on file    Active member of club or organization: Not on file    Attends meetings of clubs or organizations: Not on file    Relationship status: Not on file  Other Topics Concern  . Not on file  Social History Narrative  . Not on file     FAMILY HISTORY:  We obtained a detailed, 4-generation family history.  Significant diagnoses are listed below: Family History  Problem  Relation Age of Onset  . Breast cancer Maternal Grandmother    Andrea Beltran does not have children. She has one sister, age 70, no cancer history.  Andrea Beltran mother is living in her 60s, no cancer. The patient has 3 paternal uncles, in their 63s, no cancers. No known cancers in her maternal cousins. Her maternal grandmother had breast cancer diagnosed around the age of 38 and has also had skin and kidney cancer. She is living at 1. Patient's maternal grandfather is living at 12.  Andrea Beltran father is living in his 71s, no cancer. The patient has 2 paternal uncles and 1 paternal aunt. One of her uncles had brain cancer. No known cancers in paternal cousins. Her paternal grandparents are both living in their 44s.   Andrea Beltran is unaware of previous family history of genetic testing for hereditary cancer risks. Patient's maternal and paternal ancestry is [place]. There is no reported Ashkenazi Jewish ancestry. There is no known consanguinity.  GENETIC COUNSELING ASSESSMENT: Andrea Beltran is a 33 y.o. female with a personal and family history which is somewhat suggestive of a hereditary cancer syndrome and predisposition to cancer. We, therefore, discussed and recommended the following at today's visit.   DISCUSSION: We discussed that 5-10% of breast cancer is hereditary, with most cases associated with the BRCA1/2 genes. There are other genes that can be associated with hereditary breast cancer syndromes.  These include PALB2, ATM, CHEK2, etc.  We reviewed the characteristics, features and inheritance patterns of hereditary cancer syndromes. We also discussed genetic testing, including the appropriate family members to test, the process of testing, insurance coverage and turn-around-time for results. We discussed the implications of a negative, positive and/or variant of uncertain significant result. We recommended Andrea Beltran pursue genetic testing for the [facility] Common Hereditary Cancers + Brain + Renal gene  panel.   The Common Hereditary Cancers Panel offered by [facility] includes sequencing and/or deletion duplication testing of the following 48 genes: APC, ATM, AXIN2, BARD1, BMPR1A, BRCA1, BRCA2, BRIP1, CDH1, CDKN2A (p14ARF), CDKN2A (p16INK4a), CKD4, CHEK2, CTNNA1, DICER1, EPCAM (Deletion/duplication testing only), GREM1 (promoter region deletion/duplication testing only), KIT, MEN1, MLH1, MSH2, MSH3, MSH6, MUTYH, NBN, NF1, NHTL1, PALB2, PDGFRA, PMS2, POLD1, POLE, PTEN, RAD50, RAD51C, RAD51D, RNF43, SDHB, SDHC, SDHD, SMAD4, SMARCA4. STK11, TP53, TSC1, TSC2, and VHL.  The following genes were evaluated for sequence changes only: SDHA and HOXB13 c.251G>A variant only.  The CNS/Brain Cancer Panel offered by [facility] includes sequencing and/or deletion duplication testing of the following 42 genes: ALK, APC, BAP1, BARD1, CDK4, CDKN2A, DICER1, EPCAM, EZH2, GPC3, HRAS, KIF1B, MEN1, MLH1, MSH2, MSH6, NF1, NF2, PHOX2B, PMS2, POT1, PRKAR1A, PTCH2, PTEN,  RB1, SMARCA4, SMARCB1, SMARCE1, SUFU, TP53, TSC1, TSC2, and VHL.   The [facility] Renal/Urinary Tract Cancers Panel analyzes the following 24 genes:BAP1 ,CDC73, CDKN1C, DICER1, DIS3L2, EPCAM, FH, FLCN, GPC3, MET, MLH1, MSH2, MSH6, PMS2, PTEN, SDHB, SDHC, SMARCA4, SMARCB1, TP53, TSC1, TSC2, VHL, WT1.  Based on Andrea Beltran's personal and family history of cancer, she meets medical criteria for genetic testing. Despite that she meets criteria, she may still have an out of pocket cost. The lab will notify her of an OOP if any.  PLAN: After considering the risks, benefits, and limitations, Andrea Beltran provided informed consent to pursue genetic testing. A saliva kit was mailed to her home and she will mail her sample to [facility] for analysis of the Common Hereditary Cancers Panel. Results should be available within approximately 2-3 weeks' time, at which point they will be disclosed by telephone to Andrea Beltran, as will any additional recommendations warranted by these  results. Andrea Beltran will receive a summary of her genetic counseling visit and a copy of her results once available. This information will also be available in Epic.   Lastly, we encouraged Andrea Beltran to remain in contact with cancer genetics annually so that we can continuously update the family history and inform her of any changes in cancer genetics and testing that may be of benefit for this family.   Andrea Beltran questions were answered to her satisfaction today. Our contact information was provided should additional questions or concerns arise. Thank you for the referral and allowing [place] to share in the care of your patient.   Faith Rogue, MS Genetic Counselor [email or web address] Phone: [phone]  The patient was seen for a total of 25 minutes in virtual genetic counseling.  Drs. Magrinat, Lindi Adie and/or Burr Medico were available for discussion regarding this case.

## 2018-11-21 ENCOUNTER — Ambulatory Visit (INDEPENDENT_AMBULATORY_CARE_PROVIDER_SITE_OTHER): Payer: Commercial Managed Care - PPO

## 2018-11-21 ENCOUNTER — Other Ambulatory Visit: Payer: Self-pay

## 2018-11-21 ENCOUNTER — Ambulatory Visit: Payer: Commercial Managed Care - PPO | Admitting: Family Medicine

## 2018-11-21 VITALS — BP 110/62 | HR 82 | Temp 98.0°F | Wt 135.0 lb

## 2018-11-21 DIAGNOSIS — Z803 Family history of malignant neoplasm of breast: Secondary | ICD-10-CM

## 2018-11-21 DIAGNOSIS — T451X5A Adverse effect of antineoplastic and immunosuppressive drugs, initial encounter: Secondary | ICD-10-CM

## 2018-11-21 DIAGNOSIS — R0789 Other chest pain: Secondary | ICD-10-CM

## 2018-11-21 DIAGNOSIS — R232 Flushing: Secondary | ICD-10-CM

## 2018-11-21 LAB — POCT URINE PREGNANCY: Preg Test, Ur: NEGATIVE

## 2018-11-21 IMAGING — DX CHEST - 2 VIEW
2 series · 2 of 2 positions shown · non-contrast
Comparison: None.

CLINICAL DATA: Family history of breast and renal carcinoma.

EXAM:
CHEST - 2 VIEW

[chest pa]
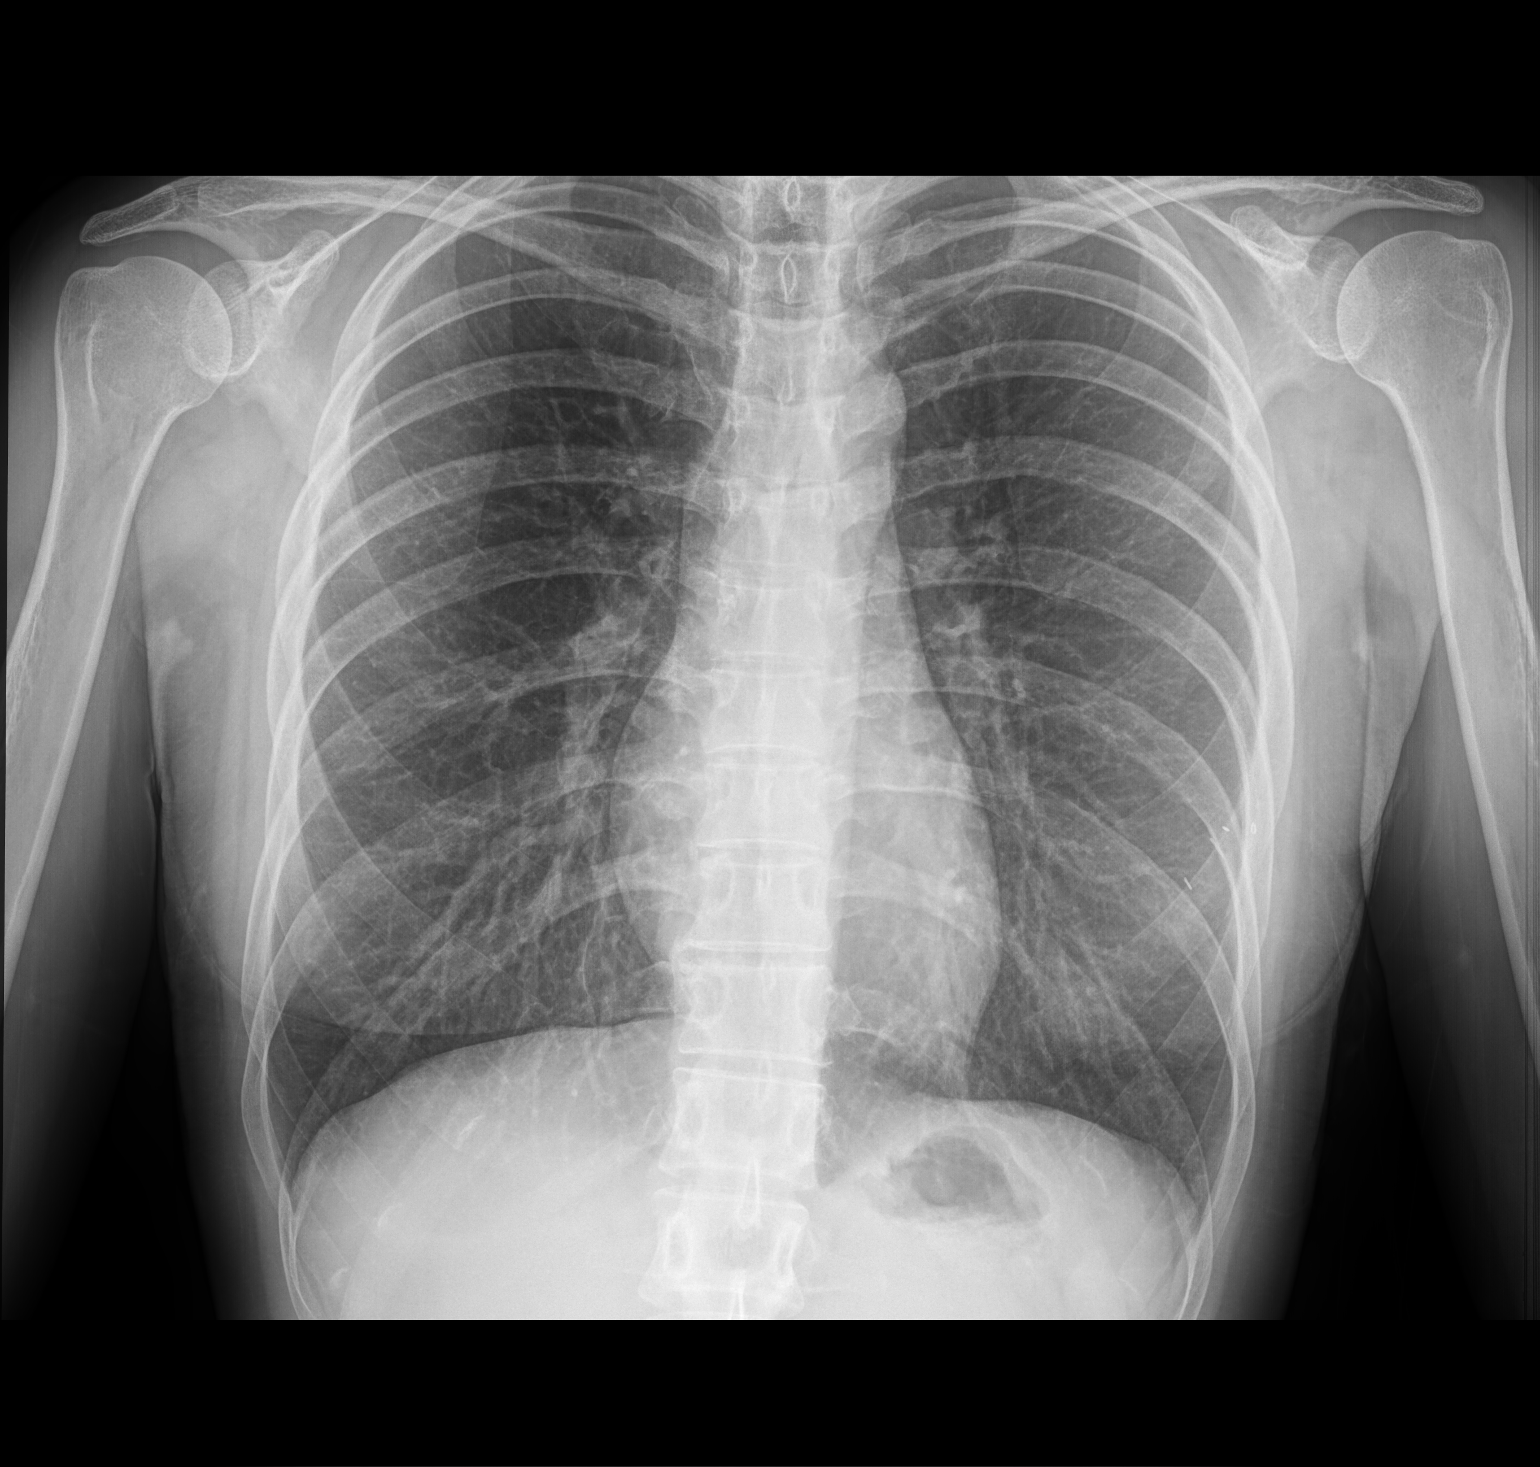

[chest lat]
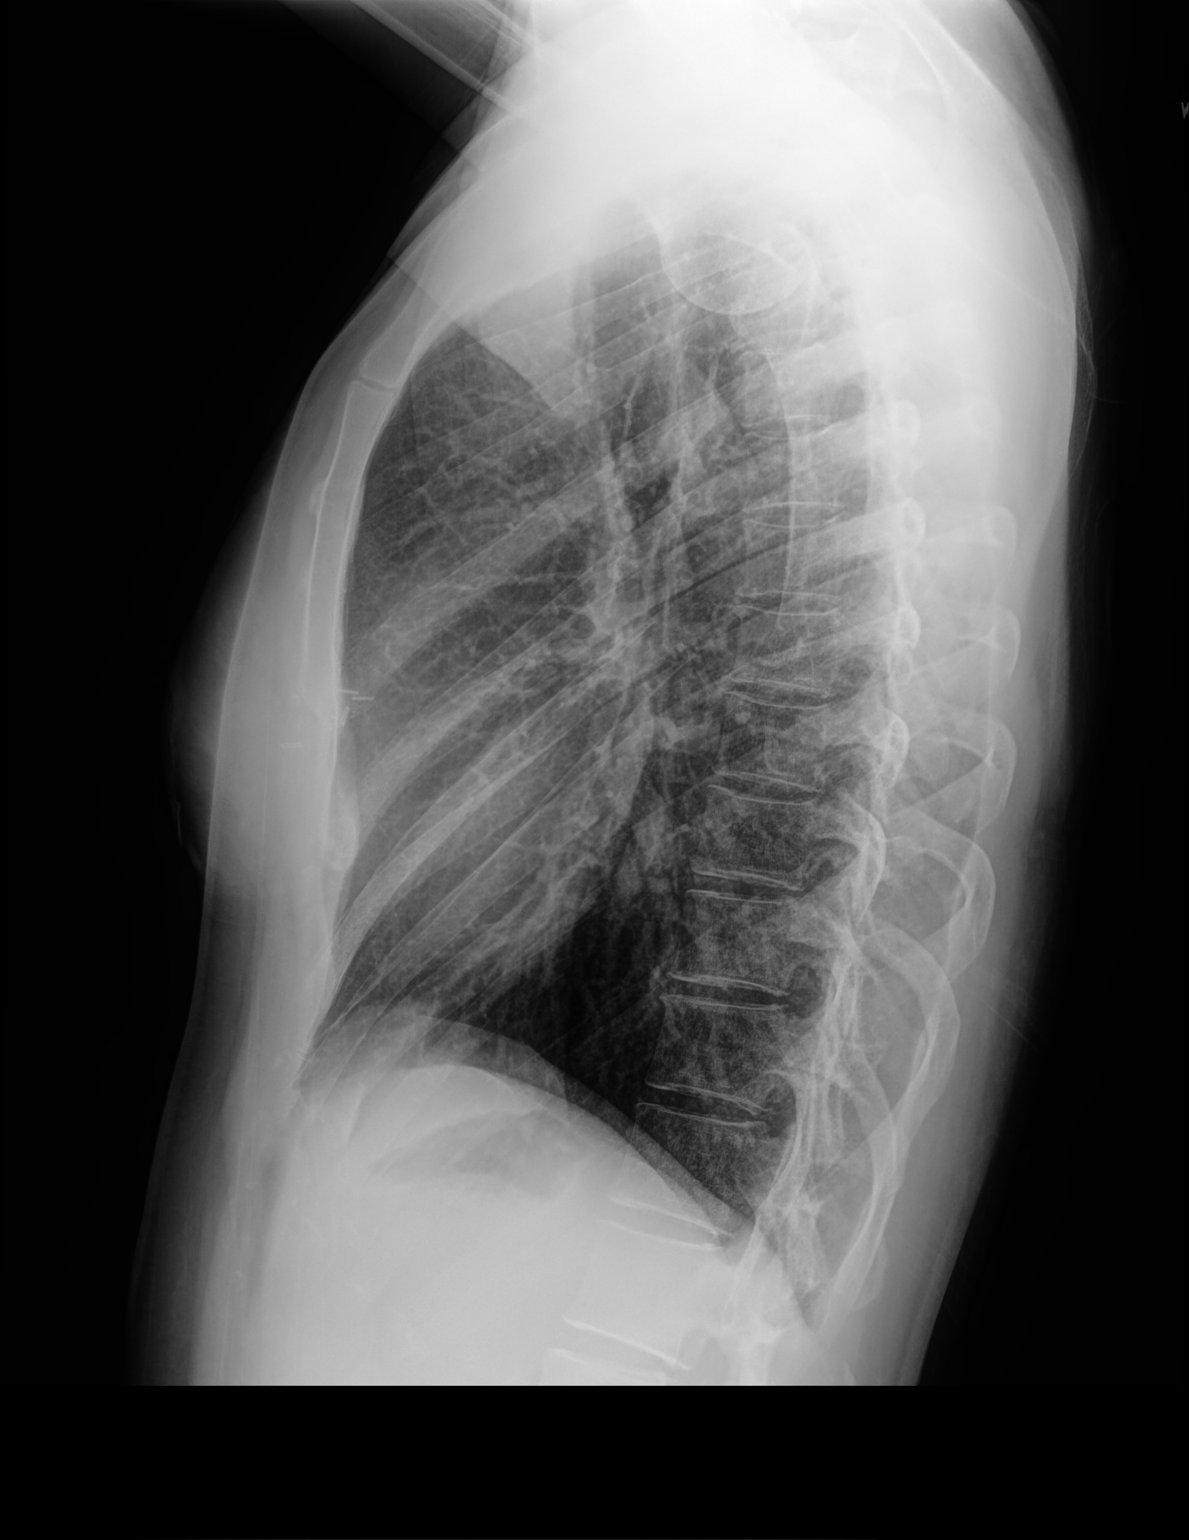

[2 of 2 positions shown; findings below may reference images not displayed]

FINDINGS: Lungs are clear. Heart size and pulmonary vascularity are normal. No
adenopathy. No bone lesions. Are surgical clips in the left breast
region.
IMPRESSION: No edema or consolidation.  No evident adenopathy.

## 2018-11-22 ENCOUNTER — Encounter: Payer: Self-pay | Admitting: Family Medicine

## 2018-11-22 ENCOUNTER — Ambulatory Visit (INDEPENDENT_AMBULATORY_CARE_PROVIDER_SITE_OTHER): Payer: Commercial Managed Care - PPO | Admitting: Family Medicine

## 2018-11-22 DIAGNOSIS — C50412 Malignant neoplasm of upper-outer quadrant of left female breast: Secondary | ICD-10-CM

## 2018-11-22 DIAGNOSIS — R0789 Other chest pain: Secondary | ICD-10-CM

## 2018-11-22 DIAGNOSIS — T50905D Adverse effect of unspecified drugs, medicaments and biological substances, subsequent encounter: Secondary | ICD-10-CM

## 2018-11-22 DIAGNOSIS — Z79899 Other long term (current) drug therapy: Secondary | ICD-10-CM

## 2018-11-22 DIAGNOSIS — Z170 Estrogen receptor positive status [ER+]: Secondary | ICD-10-CM

## 2018-11-22 NOTE — Progress Notes (Signed)
Virtual Visit via Video Note  Subjective  CC:  Chief Complaint  Patient presents with   Follow-up    chest pressure      I connected with [name] on 11/22/18 at  1:00 PM EDT by a video enabled telemedicine application and verified that I am speaking with the correct person using two identifiers. Location patient: Home Location provider: [facility], Office Persons participating in the virtual visit: Lillias Difrancesco, Leamon Arnt, MD Zena Amos, RN  I discussed the limitations of evaluation and management by telemedicine and the availability of in person appointments. The patient expressed understanding and agreed to proceed. HPI: Andrea Beltran is a 33 y.o. female who was contacted today to address the problems listed above in the chief complaint.  Very pleasant 33 year old female who was diagnosed with left breast cancer last year.  She received surgery and radiation.  Then was started on tamoxifen in December.  She reports that a month or 2 after starting it, she started having leg cramps and chest pressure.  The symptoms have been persisting.  She has spoken to her oncologist about it and he recommends stopping the tamoxifen for 2 weeks to see if things improved.  She did this, symptoms improved, and then returned after restarting it.  She has not had follow-up with her oncologist since.  In the interim, she has been reading about tamoxifen and learned of the association or risk for thromboembolisms.  She is worried that she could be having a blood clot.  She was evaluated her nurse visit yesterday with an EKG, chest x-ray and vital signs.  Heart rate was normal, oxygenation was 90% on room air, EKG was within normal limits.  Patient denies chest pain, pleuritic chest pain, tachypnea, dyspnea on exertion, lower extremity swelling or calf pain.  She does describe intermittent muscle cramps and and ongoing chest pressure that at times is associated with  feeling like she cannot take in a deep breath.  Again, the symptoms have been ongoing for the last 2 to 3 months.  They are unchanged.  She has no fevers cough, chills or myalgias.  She admits that she has been very stressed, in part due to the diagnosis and treatment plan, but also due to a high stress job.  She brought in her notice and will be quitting in July.  Sleep is interrupted since she is working in international hours.  She denies history of depression, anxiety or panic attacks. Assessment  1. Sensation of chest pressure   2. Malignant neoplasm of upper-outer quadrant of left breast in female, estrogen receptor positive ([place])   3. Adverse effect of drug, subsequent encounter   4. High risk medication use      Plan   Chest pressure, atypical and leg cramps: Ongoing symptoms are most consistent with side effects from tamoxifen.  Patient appears well.  No respiratory distress.  Normal work-up so far.  I reassured her.  I recommend follow-up in the office with me or Dr. Juleen China in the next 1 to 5 days so that we may examine her.  DVT or PE are unlikely even given risk factor of tamoxifen.  Patient will monitor her symptoms, work on stress reduction and relaxation techniques, follow-up with her oncologist to discuss side effects from tamoxifen, and go directly to the emergency room for any shortness of breath, pleuritic chest pain, tachypnea, tachycardia, leg swelling or pain.  Breast cancer: Per oncology I  discussed the assessment and treatment plan with the patient. The patient was provided an opportunity to ask questions and all were answered. The patient agreed with the plan and demonstrated an understanding of the instructions.   The patient was advised to call back or seek an in-person evaluation if the symptoms worsen or if the condition fails to improve as anticipated. Follow up: 1 to 5 days for an office visit for recheck. Visit date not found  No orders of the defined types were  placed in this encounter.     I reviewed the patients updated PMH, FH, and SocHx.    Patient Active Problem List   Diagnosis Date Noted   Family history of breast cancer    Family history of kidney cancer    Family history of brain cancer    Hot flashes due to tamoxifen 11/09/2018   Malignant neoplasm of upper-outer quadrant of left breast in female, estrogen receptor positive ([place]) 07/30/2018   Seasonal allergic rhinitis 02/10/2017   Insomnia 02/10/2017   Vitamin D deficiency 02/10/2017   Current Meds  Medication Sig   eszopiclone (LUNESTA) 2 MG TABS tablet Take 2 mg by mouth at bedtime as needed. Take immediately before bedtime   fluticasone (FLONASE) 50 MCG/ACT nasal spray Place 1 spray into the nose daily as needed.    tamoxifen (NOLVADEX) 20 MG tablet Take 1 tablet (20 mg total) by mouth daily.    Allergies: Patient is allergic to cat hair extract and pollen extract. Family History: Patient family history includes Breast cancer in her maternal grandmother. Social History:  Patient  reports that she has never smoked. She has never used smokeless tobacco.  Review of Systems: Constitutional: Negative for fever malaise or anorexia Cardiovascular: negative for chest pain, but describes a heaviness in her chest, constant.,  Nonexertional, negative palpitations Respiratory: negative for SOB or persistent cough Gastrointestinal: negative for abdominal pain Musculoskeletal: Positive muscle cramps, no lower extremity swelling  OBJECTIVE Vitals: There were no vitals taken for this visit.  Please refer to vitals from yesterday.  They were normal. General: no acute distress , A&Ox3, no respiratory distress. HEENT, no retractions, normal speech  Leamon Arnt, MD

## 2018-11-22 NOTE — Telephone Encounter (Signed)
Called pt and scheduled virtual visit with Dr. Jonni Sanger for today at 3:00.

## 2018-11-25 ENCOUNTER — Encounter: Payer: Self-pay | Admitting: Family Medicine

## 2018-11-25 NOTE — Progress Notes (Signed)
Results for orders placed or performed in visit on 11/21/18  POCT urine pregnancy  Result Value Ref Range   Preg Test, Ur Negative Negative   EKG: normal EKG, normal sinus rhythm.  Dg Chest 2 View  Result Date: 11/22/2018 CLINICAL DATA:  Family history of breast and renal carcinoma. EXAM: CHEST - 2 VIEW COMPARISON:  None. FINDINGS: Lungs are clear. Heart size and pulmonary vascularity are normal. No adenopathy. No bone lesions. Are surgical clips in the left breast region. IMPRESSION: No edema or consolidation.  No evident adenopathy. Electronically Signed   By: Lowella Grip III M.D.   On: 11/22/2018 07:59

## 2018-11-28 ENCOUNTER — Ambulatory Visit: Payer: Commercial Managed Care - PPO | Admitting: Family Medicine

## 2018-11-28 ENCOUNTER — Other Ambulatory Visit: Payer: Self-pay

## 2018-11-28 ENCOUNTER — Encounter: Payer: Self-pay | Admitting: Family Medicine

## 2018-11-28 VITALS — BP 119/65 | HR 79 | Temp 98.9°F | Resp 16 | Ht 66.0 in | Wt 134.6 lb

## 2018-11-28 DIAGNOSIS — R0789 Other chest pain: Secondary | ICD-10-CM

## 2018-11-28 DIAGNOSIS — R252 Cramp and spasm: Secondary | ICD-10-CM | POA: Diagnosis not present

## 2018-11-28 DIAGNOSIS — R232 Flushing: Secondary | ICD-10-CM | POA: Diagnosis not present

## 2018-11-28 DIAGNOSIS — T50905D Adverse effect of unspecified drugs, medicaments and biological substances, subsequent encounter: Secondary | ICD-10-CM

## 2018-11-28 MED ORDER — ESCITALOPRAM OXALATE 10 MG PO TABS: 10.0000 mg | ORAL_TABLET | Freq: Every day | ORAL | 2 refills | Status: DC

## 2018-11-28 NOTE — Progress Notes (Signed)
Subjective  CC:  Chief Complaint  Patient presents with  . Follow-up    pt c/o cramping in lft calf . pt is taking tamoxifen   F/u after same day visit: see last note.  I reviewed notes from Dr. Juleen China HPI: Andrea Beltran is a 33 y.o. female who presents to the office today to address the problems listed above in the chief complaint.  33 yo with several week h/o atypical chest pressure and leg cramps. On tamoxifen. Doing ok. Still with intermittent feelings of sob or chest heaviness, worse during working hours (stressful job, international hours, and she has put in notice last day to be next month due to stress) w/o pleuritic cp, orthopnea, LE edema or calf pain. She admits at times over the years she feels "sob" like she needs to take a big breath in.   Breast ca on tamoxifen with leg cramps and hot flushes. Was started on effexor but only took twice due to side effects.    Assessment  1. Adverse effect of drug, subsequent encounter   2. Hot flashes due to tamoxifen   3. Atypical chest pain   4. Leg cramps      Plan   Chest pressure: suspect due to anxiety/stress. Educated and counseling done. No sign of respiratory or cardiovascular etiology at this time. Start lexapro. Recheck 4-6 weeks.   Side effects from tamoxifen: qod dosing per oncology and reassurance. Leg cramps.    Follow up: Return in about 6 weeks (around 01/09/2019) for mood follow up.  Visit date not found  No orders of the defined types were placed in this encounter.  Meds ordered this encounter  Medications  . escitalopram (LEXAPRO) 10 MG tablet    Sig: Take 1 tablet (10 mg total) by mouth daily.    Dispense:  30 tablet    Refill:  2      I reviewed the patients updated PMH, FH, and SocHx.    Patient Active Problem List   Diagnosis Date Noted  . Family history of breast cancer   . Family history of kidney cancer   . Family history of brain cancer   . Hot flashes due to tamoxifen 11/09/2018  .  Malignant neoplasm of upper-outer quadrant of left breast in female, estrogen receptor positive ([place]) 07/30/2018  . Seasonal allergic rhinitis 02/10/2017  . Insomnia 02/10/2017  . Vitamin D deficiency 02/10/2017   Current Meds  Medication Sig  . eszopiclone (LUNESTA) 2 MG TABS tablet Take 2 mg by mouth at bedtime as needed. Take immediately before bedtime  . fluticasone (FLONASE) 50 MCG/ACT nasal spray Place 1 spray into the nose daily as needed.   . tamoxifen (NOLVADEX) 20 MG tablet Take 1 tablet (20 mg total) by mouth daily.  . [DISCONTINUED] venlafaxine XR (EFFEXOR XR) 37.5 MG 24 hr capsule Take 1 capsule (37.5 mg total) by mouth daily with breakfast.    Allergies: Patient is allergic to cat hair extract and pollen extract. Family History: Patient family history includes Breast cancer in her maternal grandmother. Social History:  Patient  reports that she has never smoked. She has never used smokeless tobacco.  Review of Systems: Constitutional: Negative for fever malaise or anorexia Cardiovascular: negative for chest pain Respiratory: negative for SOB or persistent cough Gastrointestinal: negative for abdominal pain  Objective  Vitals: BP 119/65   Pulse 79   Temp 98.9 F (37.2 C) (Oral)   Resp 16   Ht 5\' 6"  (1.676 m)  Wt 134 lb 9.6 oz (61.1 kg)   SpO2 98%   BMI 21.73 kg/m  General: no acute distress , A&Ox3 HEENT: PEERL, conjunctiva normal, Oropharynx moist,neck is supple Cardiovascular:  RRR without murmur or gallop.  Respiratory:  Good breath sounds bilaterally, CTAB with normal respiratory effort Skin:  Warm, no rashes LE w/o cords, ttp, edema     Commons side effects, risks, benefits, and alternatives for medications and treatment plan prescribed today were discussed, and the patient expressed understanding of the given instructions. Patient is instructed to call or message via MyChart if he/she has any questions or concerns regarding our treatment plan. No  barriers to understanding were identified. We discussed Red Flag symptoms and signs in detail. Patient expressed understanding regarding what to do in case of urgent or emergency type symptoms.   Medication list was reconciled, printed and provided to the patient in AVS. Patient instructions and summary information was reviewed with the patient as documented in the AVS. This note was prepared with assistance of Dragon voice recognition software. Occasional wrong-word or sound-a-like substitutions may have occurred due to the inherent limitations of voice recognition software

## 2018-11-28 NOTE — Patient Instructions (Signed)
Please return in 6 weeks to recheck breathing and chest pressure.   If you have any questions or concerns, please don't hesitate to send me a message via MyChart or call the office at [phone]. Thank you for visiting with [place] today! It's our pleasure caring for you.  Please call [place] [facility] to schedule an appointment with Dr. Trey Paula; she is a therapist here at our [place] office.  The phone number is: [phone]

## 2018-11-29 ENCOUNTER — Telehealth: Payer: Self-pay | Admitting: Licensed Clinical Social Worker

## 2018-11-29 NOTE — Telephone Encounter (Signed)
Called Andrea Beltran to notify her that Andrea Beltran is unable to release a report due to sample failure. Offered to send her another saliva kit to try again or to come in for a blood draw. She would like a saliva kit sent to her home.

## 2018-12-03 ENCOUNTER — Encounter: Payer: Self-pay | Admitting: Adult Health

## 2018-12-09 ENCOUNTER — Other Ambulatory Visit: Payer: Self-pay | Admitting: Hematology and Oncology

## 2018-12-10 ENCOUNTER — Encounter: Payer: Self-pay | Admitting: Family Medicine

## 2018-12-14 ENCOUNTER — Ambulatory Visit (INDEPENDENT_AMBULATORY_CARE_PROVIDER_SITE_OTHER): Payer: Commercial Managed Care - PPO | Admitting: Family Medicine

## 2018-12-14 ENCOUNTER — Other Ambulatory Visit: Payer: Self-pay

## 2018-12-14 ENCOUNTER — Ambulatory Visit: Payer: Commercial Managed Care - PPO | Admitting: Family Medicine

## 2018-12-14 VITALS — Ht 66.0 in | Wt 134.0 lb

## 2018-12-14 DIAGNOSIS — R232 Flushing: Secondary | ICD-10-CM | POA: Diagnosis not present

## 2018-12-14 DIAGNOSIS — J301 Allergic rhinitis due to pollen: Secondary | ICD-10-CM | POA: Diagnosis not present

## 2018-12-14 DIAGNOSIS — T451X5A Adverse effect of antineoplastic and immunosuppressive drugs, initial encounter: Secondary | ICD-10-CM | POA: Diagnosis not present

## 2018-12-14 DIAGNOSIS — F5101 Primary insomnia: Secondary | ICD-10-CM

## 2018-12-14 MED ORDER — GABAPENTIN 100 MG PO CAPS: 100.0000 mg | ORAL_CAPSULE | Freq: Every day | ORAL | 3 refills | Status: DC

## 2018-12-14 MED ORDER — FLUTICASONE PROPIONATE 50 MCG/ACT NA SUSP: 2.0000 | Freq: Every day | NASAL | 6 refills | Status: DC

## 2018-12-14 MED ORDER — ESZOPICLONE 2 MG PO TABS: 2.0000 mg | ORAL_TABLET | Freq: Every evening | ORAL | 3 refills | Status: DC | PRN

## 2018-12-14 NOTE — Progress Notes (Signed)
Virtual Visit via Video   Due to the COVID-19 pandemic, this visit was completed with telemedicine (audio/video) technology to reduce patient and provider exposure as well as to preserve personal protective equipment.   I connected with Andrea Beltran by a video enabled telemedicine application and verified that I am speaking with the correct person using two identifiers. Location patient: Home Location provider: [facility], Office Persons participating in the virtual visit: Andrea Beltran, Briscoe Deutscher, DO   I discussed the limitations of evaluation and management by telemedicine and the availability of in person appointments. The patient expressed understanding and agreed to proceed.  Care Team   Patient Care Team: Briscoe Deutscher, DO as PCP - General (Family Medicine) Nicholas Lose, MD as Consulting Physician (Hematology and Oncology)  Subjective:   HPI: Hx of breast cancer, now on Tamoxifen. Hot flashes, mood swings continue. Chest wall pain with increased dose of Tamoxifen. Workup normal. Oncology recommend changing brand first, then will decrease dose if needed. Patient prefers natural treatments. Hates the idea of more medication. Current sleep medication help her to get 5-6 hours of sleep.   Review of Systems  Constitutional: Negative for chills and fever.  HENT: Negative for hearing loss and tinnitus.   Eyes: Negative for blurred vision and double vision.  Respiratory: Negative for cough and wheezing.   Cardiovascular: Negative for chest pain, palpitations and leg swelling.  Gastrointestinal: Negative for nausea and vomiting.  Genitourinary: Negative for dysuria and urgency.  Neurological: Negative for dizziness and headaches.  Psychiatric/Behavioral: Negative for depression and suicidal ideas.    Patient Active Problem List   Diagnosis Date Noted  . Family history of breast cancer   . Family history of kidney cancer   . Family history of brain cancer   . Hot flashes  due to tamoxifen 11/09/2018  . Malignant neoplasm of upper-outer quadrant of left breast in female, estrogen receptor positive ([place]) 07/30/2018  . Seasonal allergic rhinitis 02/10/2017  . Insomnia 02/10/2017  . Vitamin D deficiency 02/10/2017    Social History   Tobacco Use  . Smoking status: Never Smoker  . Smokeless tobacco: Never Used  Substance Use Topics  . Alcohol use: Not on file   Current Outpatient Medications:  .  tamoxifen (NOLVADEX) 20 MG tablet, TAKE 1 TABLET(20 MG) BY MOUTH DAILY, Disp: 90 tablet, Rfl: 0  Allergies  Allergen Reactions  . Cat Hair Extract Other (See Comments)    sneezing sneezing   . Pollen Extract Other (See Comments)    Sneezing     Objective:   VITALS: Per patient if applicable, see vitals. GENERAL: Alert, appears well and in no acute distress. HEENT: Atraumatic, conjunctiva clear, no obvious abnormalities on inspection of external nose and ears. NECK: Normal movements of the head and neck. CARDIOPULMONARY: No increased WOB. Speaking in clear sentences. I:E ratio WNL.  MS: Moves all visible extremities without noticeable abnormality. PSYCH: Pleasant and cooperative, well-groomed. Speech normal rate and rhythm. Affect is appropriate. Insight and judgement are appropriate. Attention is focused, linear, and appropriate.  NEURO: CN grossly intact. Oriented as arrived to appointment on time with no prompting. Moves both UE equally.  SKIN: No obvious lesions, wounds, erythema, or cyanosis noted on face or hands.  Assessment and Plan:   Yareni was seen today for follow-up.  Diagnoses and all orders for this visit:  Hot flashes due to tamoxifen Comments: We discussed Effexor, but patient prefers no add on. Will trial Neurontin at night to see if it  helps more than the Lunesta.  Orders: -     gabapentin (NEURONTIN) 100 MG capsule; Take 1 capsule (100 mg total) by mouth at bedtime.  Seasonal allergic rhinitis due to pollen -     fluticasone  (FLONASE) 50 MCG/ACT nasal spray; Place 2 sprays into both nostrils daily.  Primary insomnia -     eszopiclone (LUNESTA) 2 MG TABS tablet; Take 1 tablet (2 mg total) by mouth at bedtime as needed for sleep. Take immediately before bedtime   . COVID-19 Education: The signs and symptoms of COVID-19 were discussed with the patient and how to seek care for testing if needed. The importance of social distancing was discussed today. . Reviewed expectations re: course of current medical issues. . Discussed self-management of symptoms. . Outlined signs and symptoms indicating need for more acute intervention. . Patient verbalized understanding and all questions were answered. Marland Kitchen Health Maintenance issues including appropriate healthy diet, exercise, and smoking avoidance were discussed with patient. . See orders for this visit as documented in the electronic medical record.  Briscoe Deutscher, DO  Records requested if needed. Time spent: 25 minutes, of which >50% was spent in obtaining information about her symptoms, reviewing her previous labs, evaluations, and treatments, counseling her about her condition (please see the discussed topics above), and developing a plan to further investigate it; she had a number of questions which I addressed.

## 2018-12-16 ENCOUNTER — Encounter: Payer: Self-pay | Admitting: Family Medicine

## 2019-01-28 ENCOUNTER — Encounter: Payer: Self-pay | Admitting: Adult Health

## 2019-02-05 ENCOUNTER — Ambulatory Visit: Payer: Commercial Managed Care - PPO | Admitting: Hematology and Oncology

## 2019-02-28 DIAGNOSIS — Z853 Personal history of malignant neoplasm of breast: Secondary | ICD-10-CM | POA: Insufficient documentation

## 2019-03-13 ENCOUNTER — Encounter: Payer: Self-pay | Admitting: Adult Health

## 2019-03-21 ENCOUNTER — Other Ambulatory Visit: Payer: Self-pay | Admitting: Hematology and Oncology

## 2019-03-25 ENCOUNTER — Encounter: Payer: Self-pay | Admitting: Family Medicine

## 2019-03-26 ENCOUNTER — Encounter: Payer: Self-pay | Admitting: Family Medicine

## 2019-03-26 ENCOUNTER — Other Ambulatory Visit: Payer: Self-pay | Admitting: Family Medicine

## 2019-03-26 ENCOUNTER — Other Ambulatory Visit: Payer: Self-pay

## 2019-03-26 ENCOUNTER — Ambulatory Visit (INDEPENDENT_AMBULATORY_CARE_PROVIDER_SITE_OTHER): Payer: Managed Care, Other (non HMO) | Admitting: Family Medicine

## 2019-03-26 VITALS — BP 118/78 | HR 88 | Temp 98.4°F | Resp 14 | Ht 66.0 in | Wt 131.6 lb

## 2019-03-26 DIAGNOSIS — Z23 Encounter for immunization: Secondary | ICD-10-CM | POA: Diagnosis not present

## 2019-03-26 DIAGNOSIS — R1011 Right upper quadrant pain: Secondary | ICD-10-CM | POA: Diagnosis not present

## 2019-03-26 LAB — CBC WITH DIFFERENTIAL/PLATELET
Basophils Absolute: 0 10*3/uL (ref 0.0–0.1)
Basophils Relative: 0.5 % (ref 0.0–3.0)
Eosinophils Absolute: 0.1 10*3/uL (ref 0.0–0.7)
Eosinophils Relative: 3.1 % (ref 0.0–5.0)
HCT: 39.7 % (ref 36.0–46.0)
Hemoglobin: 13.2 g/dL (ref 12.0–15.0)
Lymphocytes Relative: 34.5 % (ref 12.0–46.0)
Lymphs Abs: 1.6 10*3/uL (ref 0.7–4.0)
MCHC: 33.1 g/dL (ref 30.0–36.0)
MCV: 90.6 fl (ref 78.0–100.0)
Monocytes Absolute: 0.4 10*3/uL (ref 0.1–1.0)
Monocytes Relative: 9.6 % (ref 3.0–12.0)
Neutro Abs: 2.4 10*3/uL (ref 1.4–7.7)
Neutrophils Relative %: 52.3 % (ref 43.0–77.0)
Platelets: 190 10*3/uL (ref 150.0–400.0)
RBC: 4.38 Mil/uL (ref 3.87–5.11)
RDW: 13 % (ref 11.5–15.5)
WBC: 4.6 10*3/uL (ref 4.0–10.5)

## 2019-03-26 LAB — COMPREHENSIVE METABOLIC PANEL
ALT: 14 U/L (ref 0–35)
AST: 21 U/L (ref 0–37)
Albumin: 4.4 g/dL (ref 3.5–5.2)
Alkaline Phosphatase: 49 U/L (ref 39–117)
BUN: 10 mg/dL (ref 6–23)
CO2: 29 mEq/L (ref 19–32)
Calcium: 9.7 mg/dL (ref 8.4–10.5)
Chloride: 103 mEq/L (ref 96–112)
Creatinine, Ser: 0.64 mg/dL (ref 0.40–1.20)
GFR: 107.08 mL/min (ref >60.00)
Glucose, Bld: 96 mg/dL (ref 70–99)
Potassium: 3.9 mEq/L (ref 3.5–5.1)
Sodium: 140 mEq/L (ref 135–145)
Total Bilirubin: 0.4 mg/dL (ref 0.2–1.2)
Total Protein: 8 g/dL (ref 6.0–8.3)

## 2019-03-26 NOTE — Progress Notes (Signed)
Subjective  CC:  Chief Complaint  Patient presents with  . Flank Pain    Right side, has been on going for years, and has gotten more frequent. Has changed her diet since more frequen episodes    HPI: Andrea Beltran is a 33 y.o. female who presents to the office today to address the problems listed above in the chief complaint.  Pleasant 33 year old female being treated for breast cancer with tamoxifen presents due to increasing right upper quadrant pain.  She describes acute, sudden, brief episodes of sharp right upper quadrant pain that lasts for moments to 1 to 2 minutes.  She is noticed that at times it occurs after eating, most recently after a greasy pizza meal.  She denies persistent symptoms.  No nausea, vomiting, heartburn symptoms, reflux, change in bowel habits, fevers or chills.  She is concerned about her liver or gallbladder.  Symptoms started several years ago but now are more frequent.  They have not been worse in intensity and the quality is unchanged. Assessment  1. RUQ pain   2. Need for immunization against influenza      Plan   Atypical right upper quadrant pain: Given patient's concern, will check lab work and ultrasound.  Flu shot updated today.  Follow up: As needed Visit date not found  Orders Placed This Encounter  Procedures  . US ABDOMEN LIMITED RUQ  . Flu Vaccine QUAD 36+ mos IM  . CBC with Differential/Platelet  . Comprehensive metabolic panel   No orders of the defined types were placed in this encounter.     I reviewed the patients updated PMH, FH, and SocHx.    Patient Active Problem List   Diagnosis Date Noted  . Family history of breast cancer   . Family history of kidney cancer   . Family history of brain cancer   . Hot flashes due to tamoxifen 11/09/2018  . Malignant neoplasm of upper-outer quadrant of left breast in female, estrogen receptor positive ([place]) 07/30/2018  . Seasonal allergic rhinitis 02/10/2017  . Insomnia 02/10/2017   . Vitamin D deficiency 02/10/2017   Current Meds  Medication Sig  . fluticasone (FLONASE) 50 MCG/ACT nasal spray Place 2 sprays into both nostrils daily.  . tamoxifen (NOLVADEX) 20 MG tablet TAKE 1 TABLET(20 MG) BY MOUTH DAILY  . [DISCONTINUED] eszopiclone (LUNESTA) 2 MG TABS tablet Take 1 tablet (2 mg total) by mouth at bedtime as needed for sleep. Take immediately before bedtime  . [DISCONTINUED] gabapentin (NEURONTIN) 100 MG capsule Take 1 capsule (100 mg total) by mouth at bedtime.    Allergies: Patient is allergic to cat hair extract and pollen extract. Family History: Patient family history includes Breast cancer in her maternal grandmother. Social History:  Patient  reports that she has never smoked. She has never used smokeless tobacco.  Review of Systems: Constitutional: Negative for fever malaise or anorexia Cardiovascular: negative for chest pain Respiratory: negative for SOB or persistent cough Gastrointestinal: Positive for abdominal pain  Objective  Vitals: BP 118/78   Pulse 88   Temp 98.4 F (36.9 C) (Tympanic)   Resp 14   Ht 5\' 6"  (1.676 m)   Wt 131 lb 9.6 oz (59.7 kg)   SpO2 99%   BMI 21.24 kg/m  General: no acute distress , A&Ox3 HEENT: PEERL, conjunctiva normal, anicteric oropharynx moist,neck is supple Cardiovascular:  RRR without murmur or gallop.  Respiratory:  Good breath sounds bilaterally, CTAB with normal respiratory effort Gastrointestinal: soft, flat abdomen, normal  active bowel sounds, no palpable masses, no hepatosplenomegaly, no appreciated hernias  Skin:  Warm, no rashes     Commons side effects, risks, benefits, and alternatives for medications and treatment plan prescribed today were discussed, and the patient expressed understanding of the given instructions. Patient is instructed to call or message via MyChart if he/she has any questions or concerns regarding our treatment plan. No barriers to understanding were identified. We  discussed Red Flag symptoms and signs in detail. Patient expressed understanding regarding what to do in case of urgent or emergency type symptoms.   Medication list was reconciled, printed and provided to the patient in AVS. Patient instructions and summary information was reviewed with the patient as documented in the AVS. This note was prepared with assistance of Dragon voice recognition software. Occasional wrong-word or sound-a-like substitutions may have occurred due to the inherent limitations of voice recognition software

## 2019-03-26 NOTE — Patient Instructions (Signed)
Please follow up if symptoms do not improve or as needed.   I will release your lab results to you on your MyChart account with further instructions. Please reply with any questions.   We will call you with information regarding your referral appointment. Ultrasound of your liver and gallbladder.  If you do not hear from [place] within the next 2 weeks, please let me know.   Today you were given your flu vaccination.

## 2019-04-03 ENCOUNTER — Other Ambulatory Visit: Payer: Commercial Managed Care - PPO

## 2019-04-04 ENCOUNTER — Encounter: Payer: Self-pay | Admitting: Hematology and Oncology

## 2019-04-12 ENCOUNTER — Other Ambulatory Visit: Payer: Managed Care, Other (non HMO)

## 2019-04-19 ENCOUNTER — Ambulatory Visit
Admission: RE | Admit: 2019-04-19 | Discharge: 2019-04-19 | Disposition: A | Discharge status: Home / Self Care | Payer: Managed Care, Other (non HMO) | Source: Ambulatory Visit | Attending: Family Medicine | Admitting: Family Medicine

## 2019-04-19 DIAGNOSIS — R1011 Right upper quadrant pain: Secondary | ICD-10-CM

## 2019-04-19 IMAGING — US US ABDOMEN LIMITED
1 series · 14 of 25 positions shown · non-contrast
Comparison: None.

CLINICAL DATA: Upper abdominal pain

EXAM:
ULTRASOUND ABDOMEN LIMITED RIGHT UPPER QUADRANT

[Series 1: us abdomen limited · 0.22mm/px · 14 of 36 slices shown]
[im 1/36]
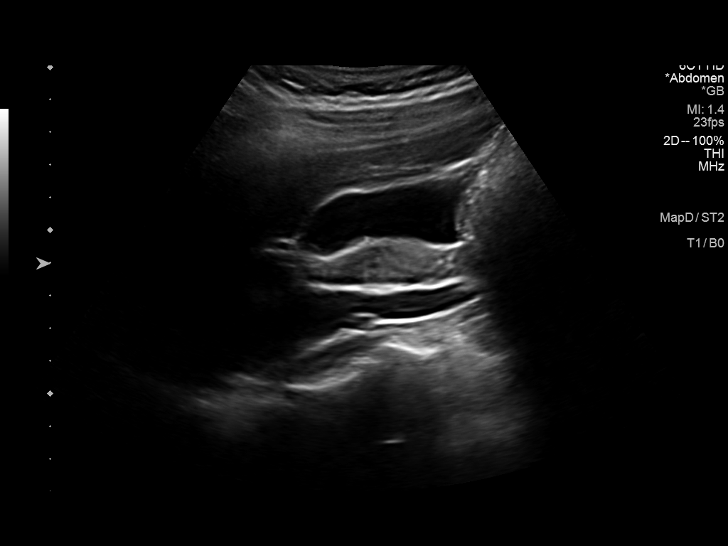
[im 3/36]
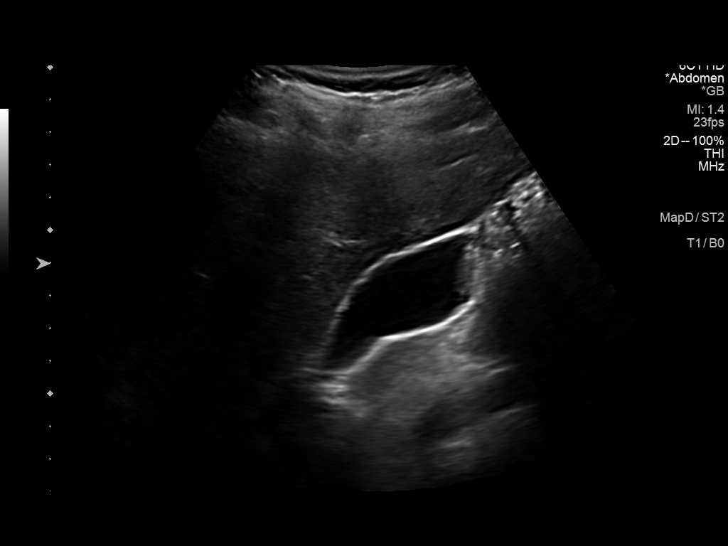
[im 6/36]
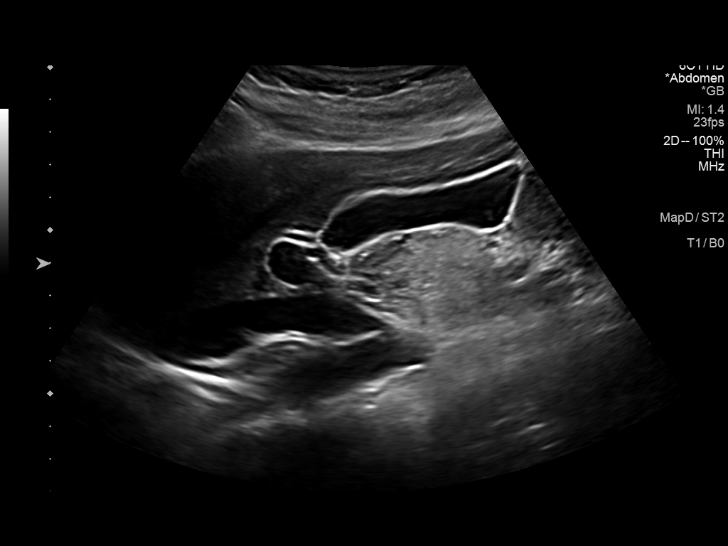
[im 9/36]
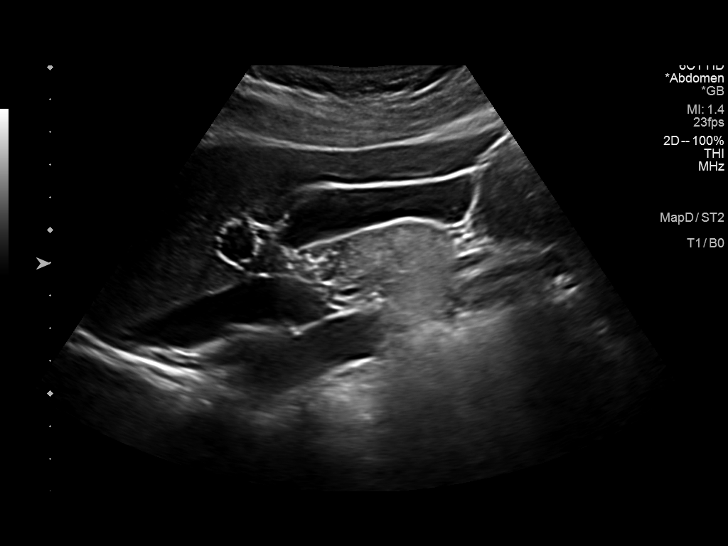
[im 12/36]
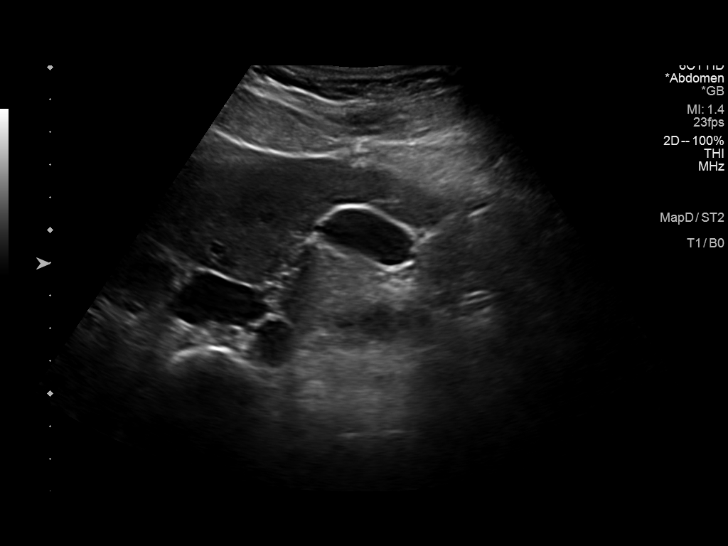
[im 14/36]
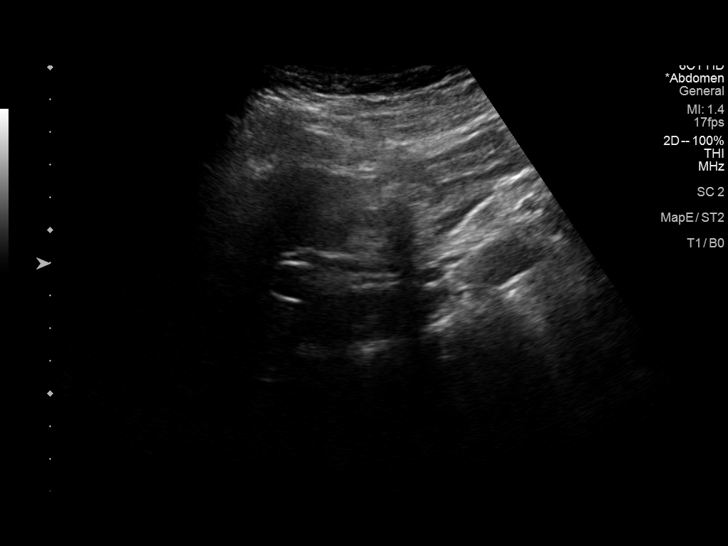
[im 17/36]
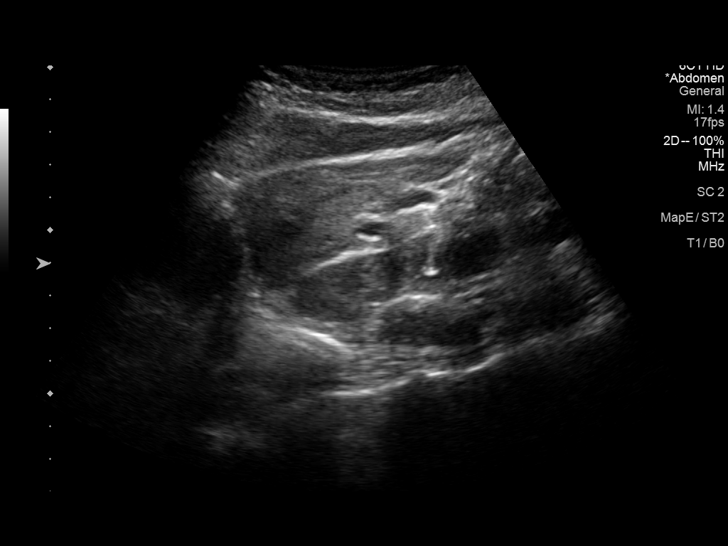
[im 19/36]
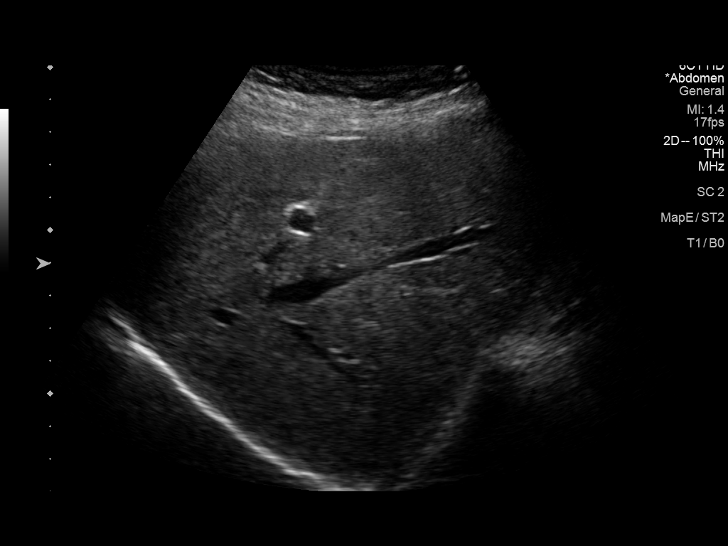
[im 22/36]
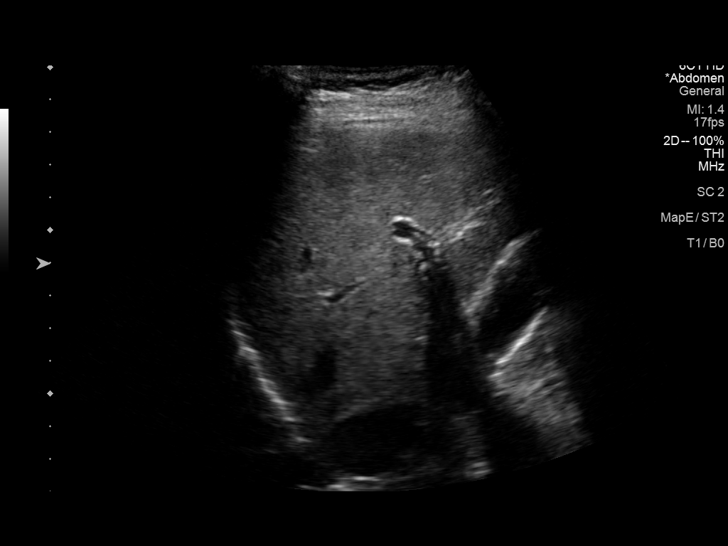
[im 24/36]
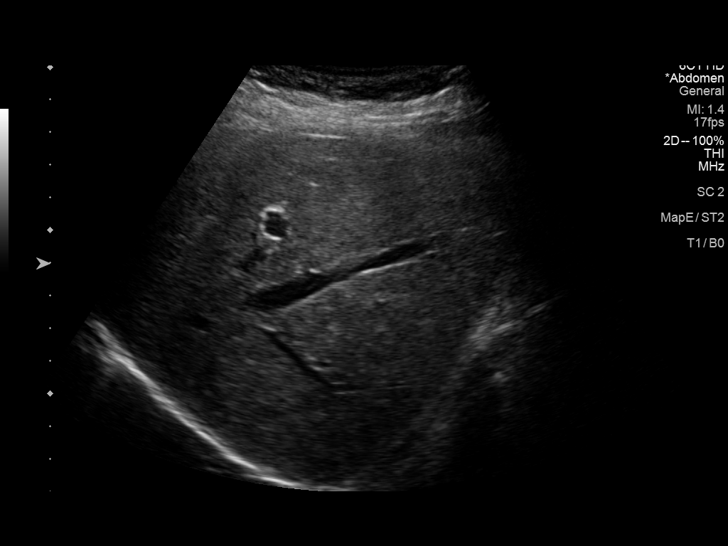
[im 27/36]
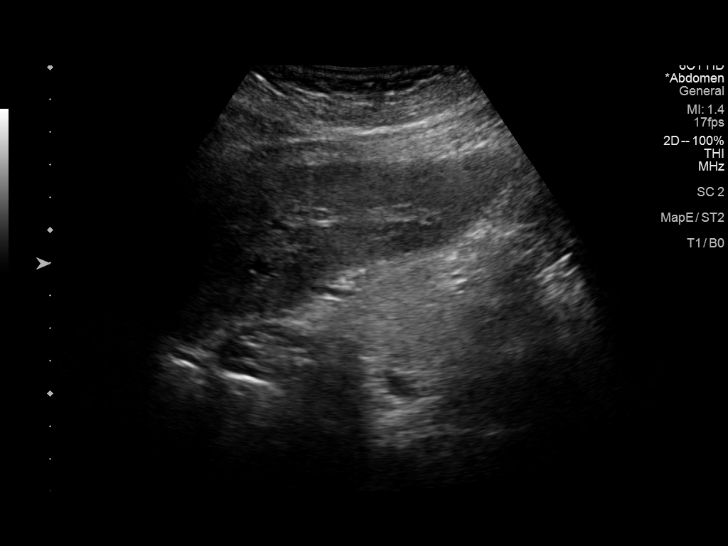
[im 30/36]
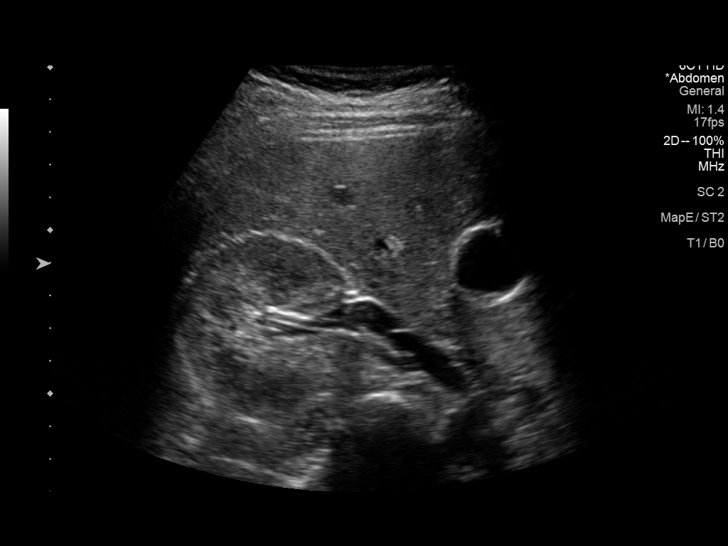
[im 33/36]
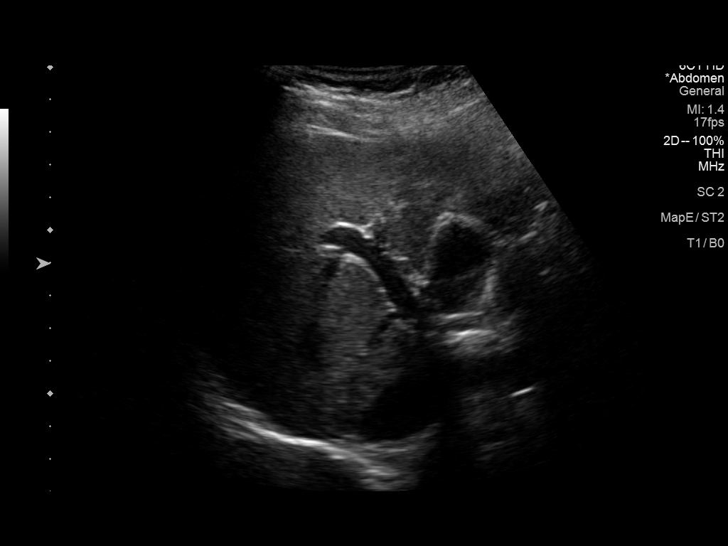
[im 36/36]
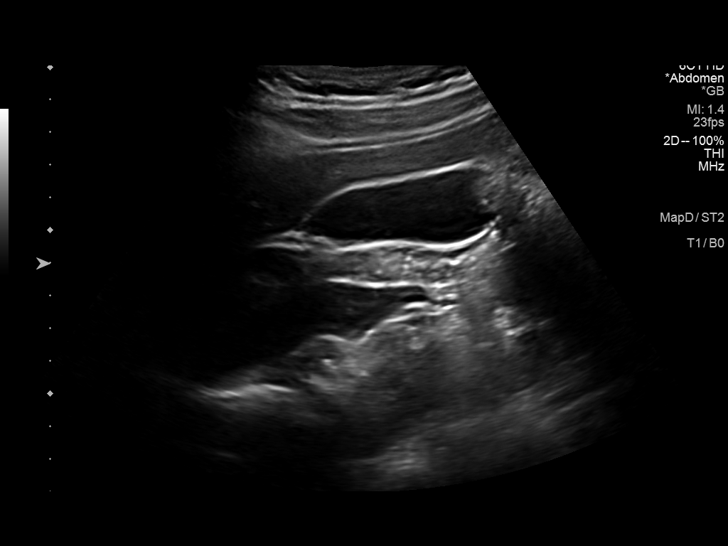

[14 of 25 positions shown; findings below may reference images not displayed]

FINDINGS: Gallbladder:

No gallstones or wall thickening visualized. There is no
pericholecystic fluid. No sonographic Murphy sign noted by
sonographer.

Common bile duct:

Diameter: 4 mm. No intrahepatic or extrahepatic biliary duct
dilatation.

Liver:

No focal lesion identified. Within normal limits in parenchymal
echogenicity. Portal vein is patent on color Doppler imaging with
normal direction of blood flow towards the liver.

Other: None.
IMPRESSION: Study within normal limits.

## 2019-04-22 ENCOUNTER — Ambulatory Visit
Admission: RE | Admit: 2019-04-22 | Discharge: 2019-04-22 | Disposition: A | Discharge status: Home / Self Care | Payer: Managed Care, Other (non HMO) | Source: Ambulatory Visit | Attending: Hematology and Oncology | Admitting: Hematology and Oncology

## 2019-04-22 ENCOUNTER — Other Ambulatory Visit: Payer: Self-pay

## 2019-04-22 DIAGNOSIS — R921 Mammographic calcification found on diagnostic imaging of breast: Secondary | ICD-10-CM

## 2019-04-22 IMAGING — MG DIGITAL DIAGNOSTIC BILAT W/ TOMO
8 of 11 series · 9 of 27 positions shown · non-contrast
Comparison: Previous exam(s).

CLINICAL DATA: 33-year-old patient with history left breast cancer
[place] The patient
is asymptomatic.

EXAM:
DIGITAL DIAGNOSTIC BILATERAL MAMMOGRAM WITH CAD AND TOMO

[R CC]
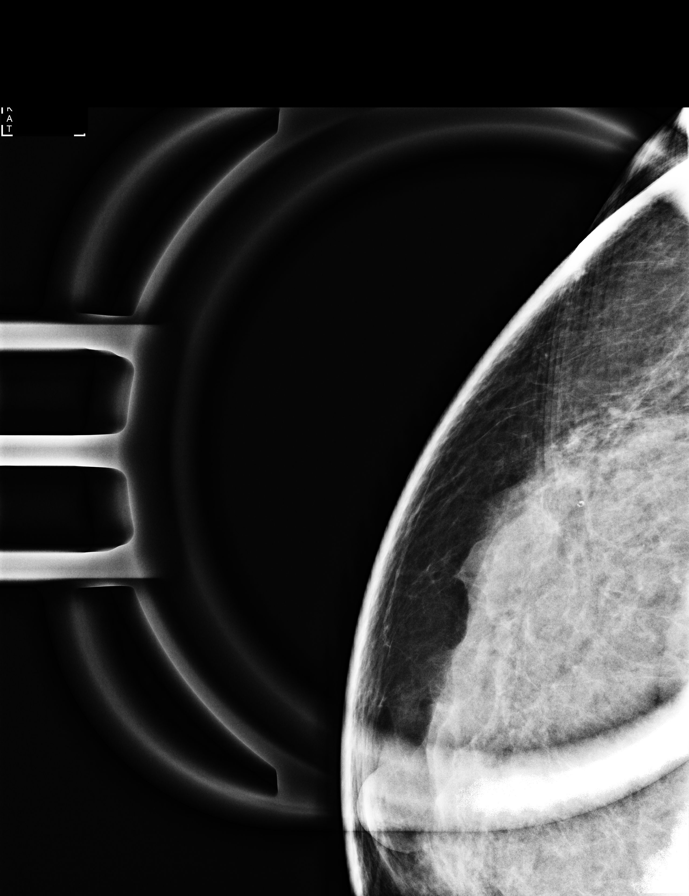

[R ML]
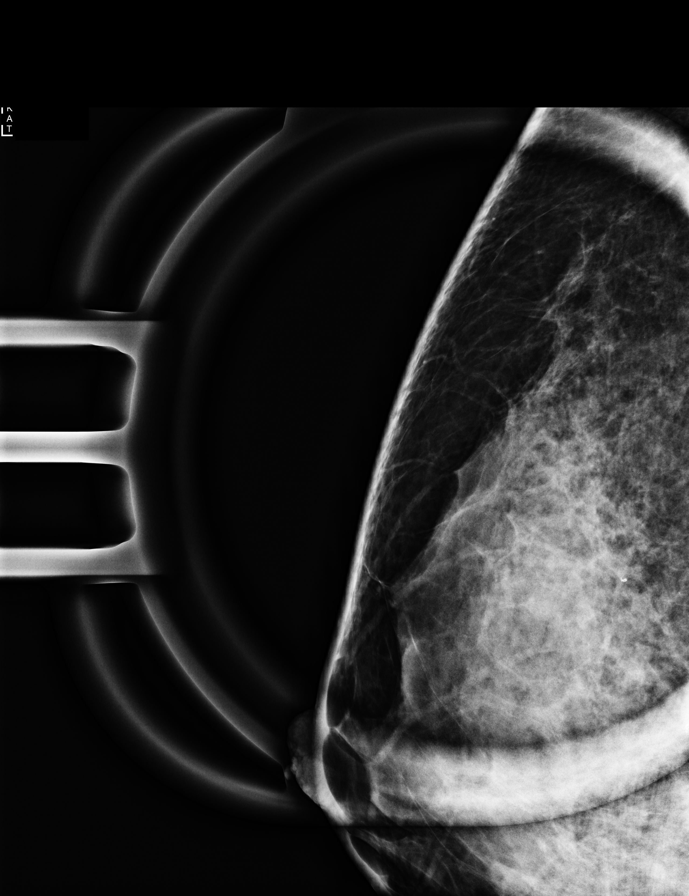

[L MLO]
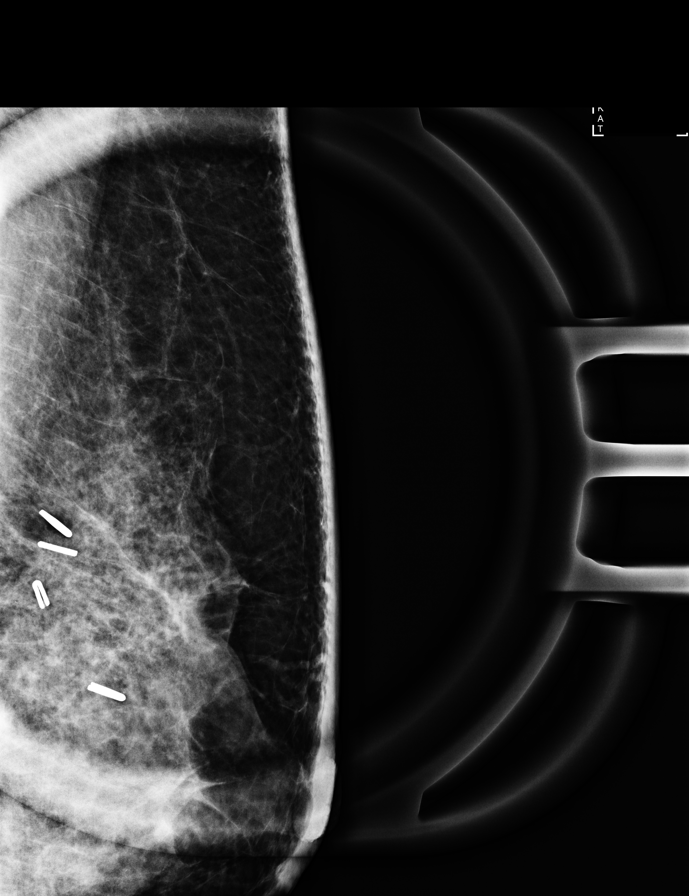

[R MLO synth-2D]
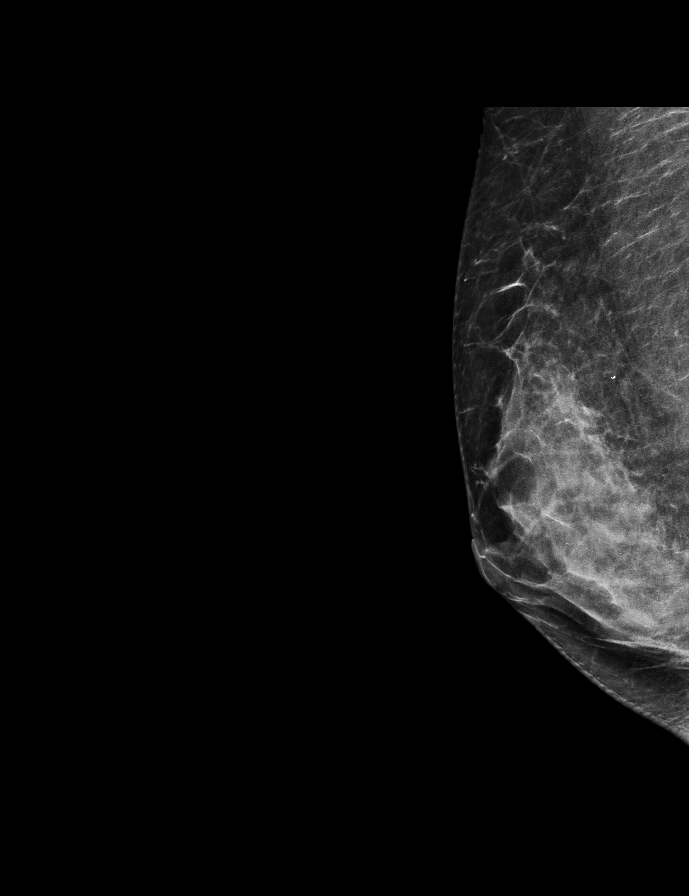

[L CC synth-2D]
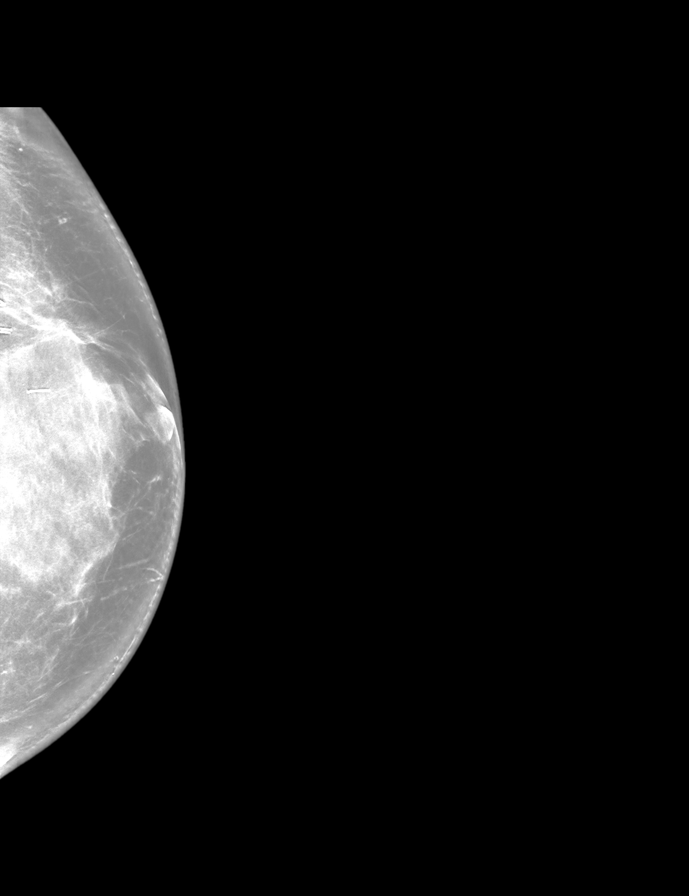

[R CC synth-2D]
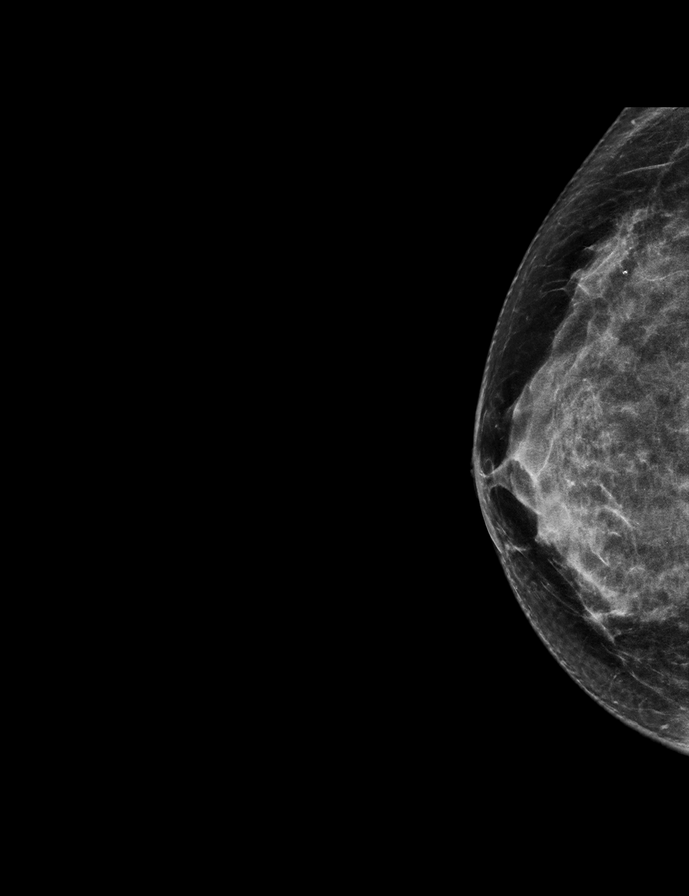

[L MLO synth-2D]
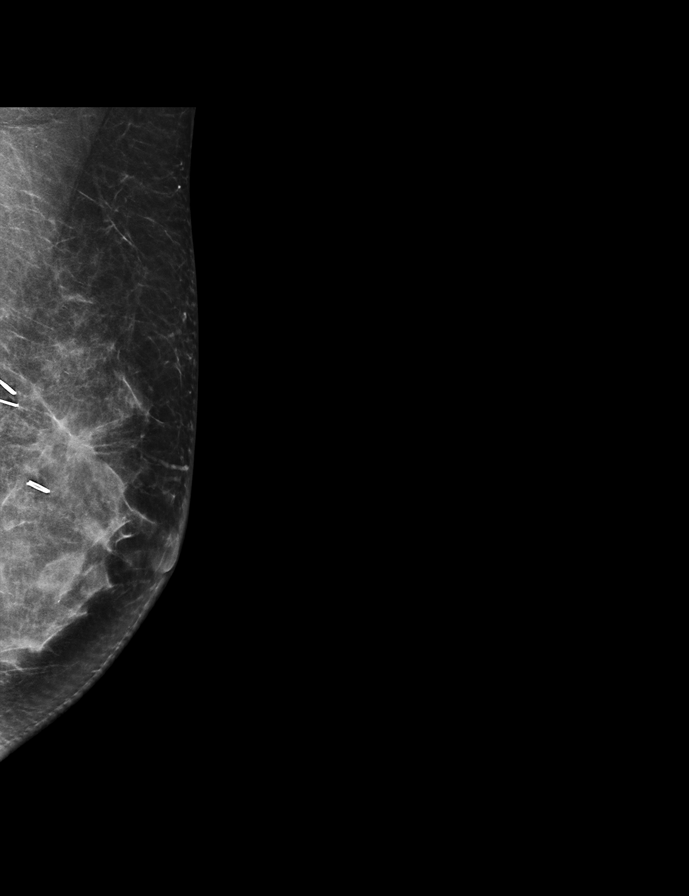

[R MLO tomo · 2 of 61 frames shown]
[frame 20/61]
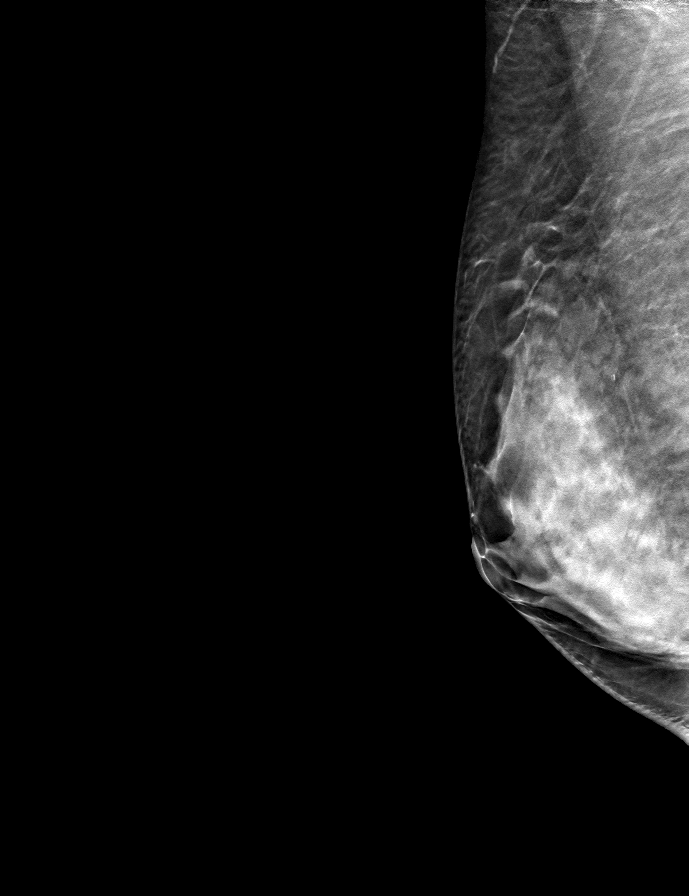
[frame 31/61]
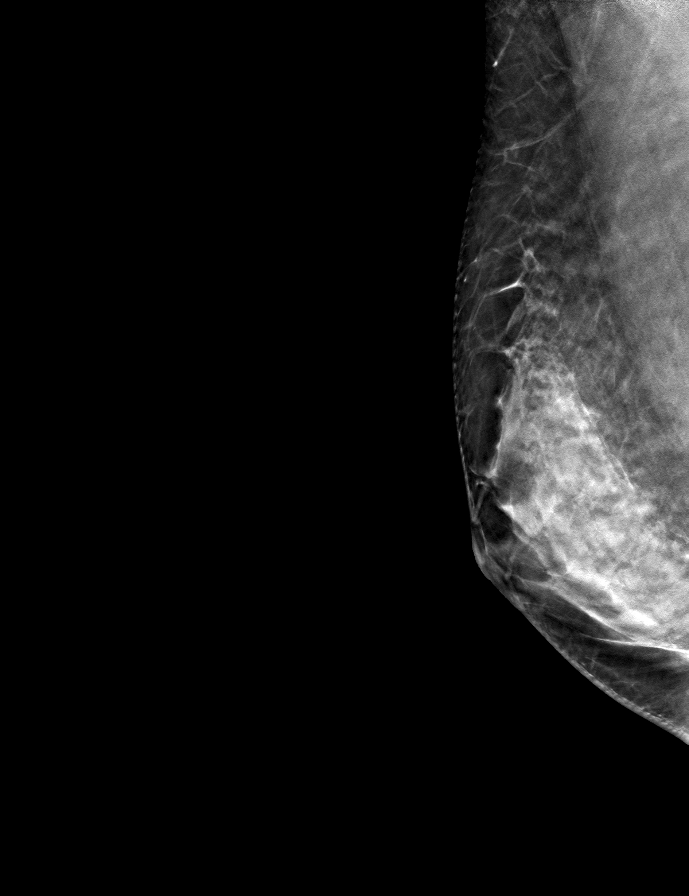

[9 of 27 positions shown; findings below may reference images not displayed]

ACR Breast Density Category d: The breast tissue is extremely dense,
which lowers the sensitivity of mammography.
FINDINGS: Magnification view the upper outer right breast shows an even more
benign appearance of coalescent dystrophic calcification(s) with a
lucent center. No suspicious microcalcification, mass, or
architectural distortion in the right breast.

Stable appearance of the lumpectomy site in the upper outer left
breast. No mass, nonsurgical distortion, or suspicious
microcalcification in the left breast.

Mammographic images were processed with CAD.
IMPRESSION: Technique changes in the left breast.

Benign dystrophic calcification in the right breast.

No evidence of malignancy in either breast.

RECOMMENDATION:
Diagnostic mammogram is suggested in 1 year. (Code:[2U])

I have discussed the findings and recommendations with the patient.
If applicable, a reminder letter will be sent to the patient
regarding the next appointment.

BI-RADS CATEGORY  2: Benign.

## 2019-04-26 ENCOUNTER — Other Ambulatory Visit: Payer: Self-pay

## 2019-04-26 ENCOUNTER — Ambulatory Visit
Admission: RE | Admit: 2019-04-26 | Discharge: 2019-04-26 | Disposition: A | Discharge status: Home / Self Care | Payer: Managed Care, Other (non HMO) | Source: Ambulatory Visit | Attending: Hematology and Oncology | Admitting: Hematology and Oncology

## 2019-04-26 DIAGNOSIS — Z170 Estrogen receptor positive status [ER+]: Secondary | ICD-10-CM

## 2019-04-26 DIAGNOSIS — C50412 Malignant neoplasm of upper-outer quadrant of left female breast: Secondary | ICD-10-CM

## 2019-04-26 IMAGING — MR MR BREAST BILAT WO/W CM
8 of 11 series · 34 of 48 positions shown · IV contrast (5 ml Gadavist)
Comparison: Previous exam(s).
COMPARISON: Previous exam(s).

Addendum:
CLINICAL DATA: 33-year-old female with personal history of left
breast cancer status post lumpectomy and radiation in [[place]].
Intermittent pain in the left breast.

LABS:  None performed today and site.
EXAM:
BILATERAL BREAST MRI WITH AND WITHOUT CONTRAST
TECHNIQUE: Multiplanar, multisequence MR images of both breasts were obtained
prior to and following the intravenous administration of 5 ml of
Gadavist.

[Series 2: t2_tirm_tra ipat (a-p) · axial · 3.0mm · 0.66mm/px · 1 of 55 slices shown]
[im 1/55]
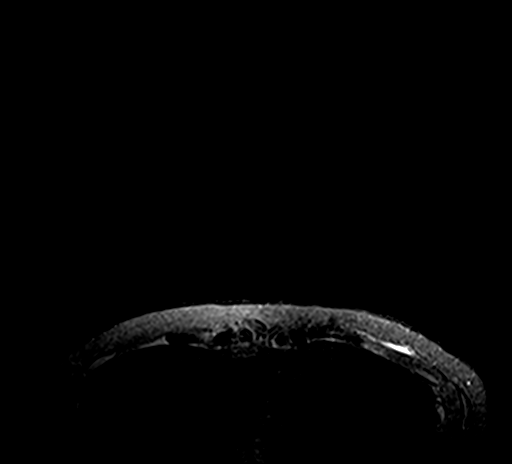

[Series 3: fl3d pre-cm no · axial · non-contrast · 1.2mm · 0.89mm/px · z∈[-48,+123]mm · 5 of 144 slices shown]
[im 1/144]
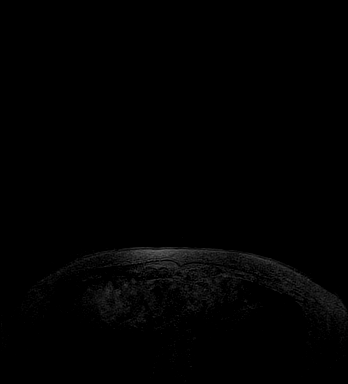
[im 36/144]
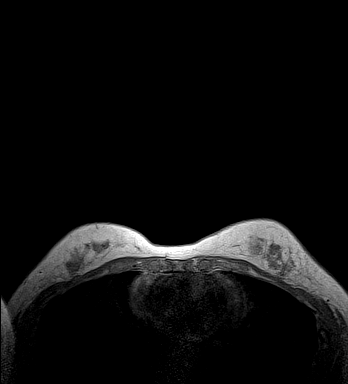
[im 72/144]
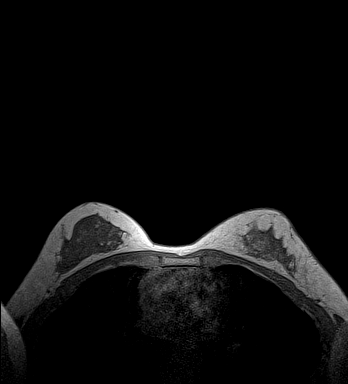
[im 108/144]
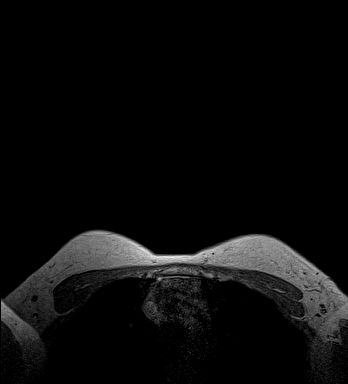
[im 144/144]
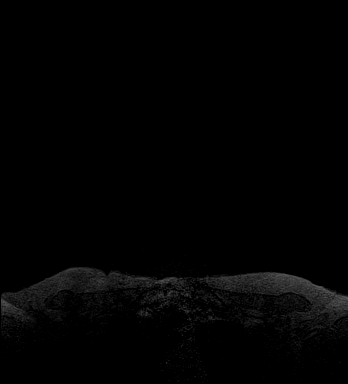

[Series 4: fl3d pre-cm · axial · non-contrast · 1.2mm · 0.89mm/px · z∈[-48,+123]mm · 5 of 144 slices shown]
[im 1/144]
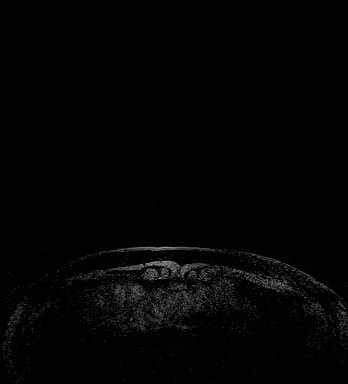
[im 36/144]
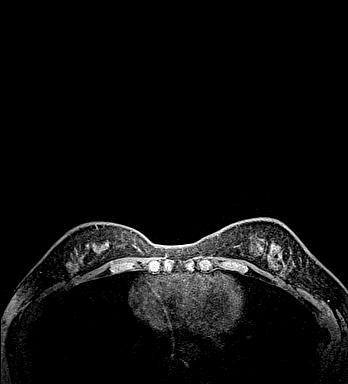
[im 72/144]
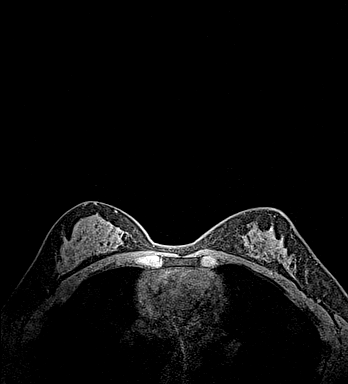
[im 108/144]
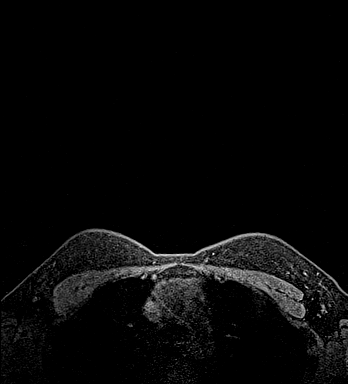
[im 144/144]
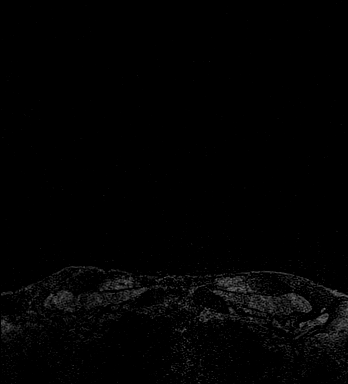

[Series 5: fl3d post-cm 20 · axial · 1.2mm · 0.89mm/px · z∈[-48,+123]mm · 5 of 144 slices shown (1 of 3)]
[im 1/144]
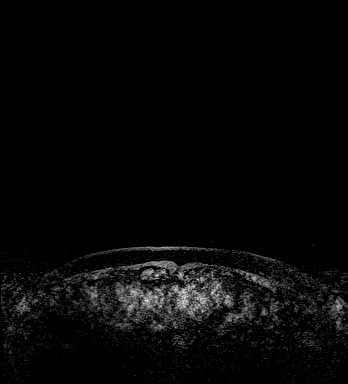
[im 36/144]
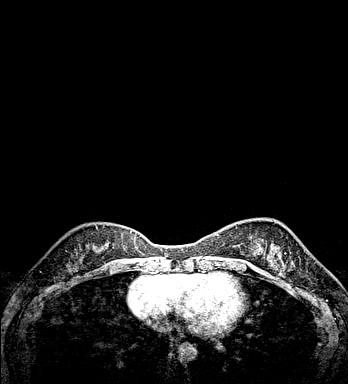
[im 72/144]
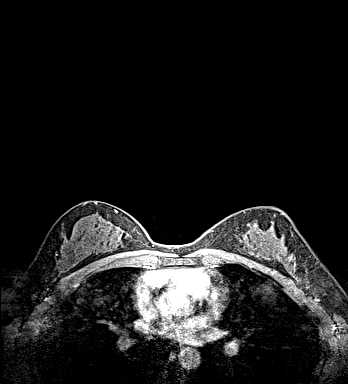
[im 108/144]
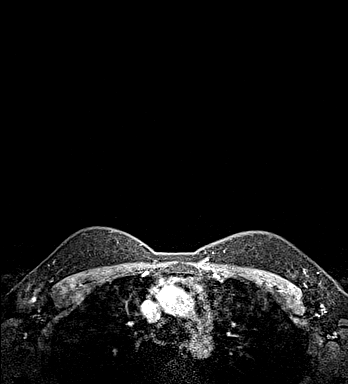
[im 144/144]
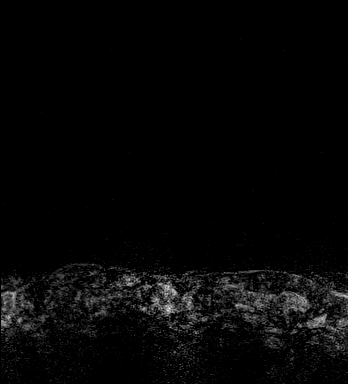

[Series 6: fl3d post-cm 20 · axial · 1.2mm · 0.89mm/px · z∈[-48,+123]mm · 6 of 144 slices shown (2 of 3)]
[im 1/144]
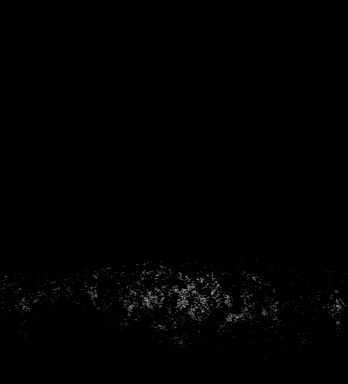
[im 29/144]
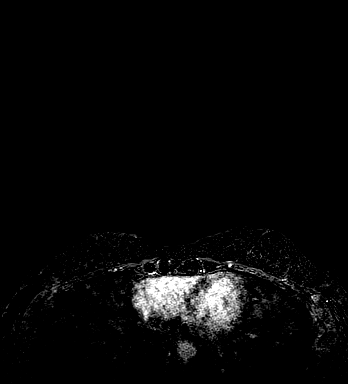
[im 58/144]
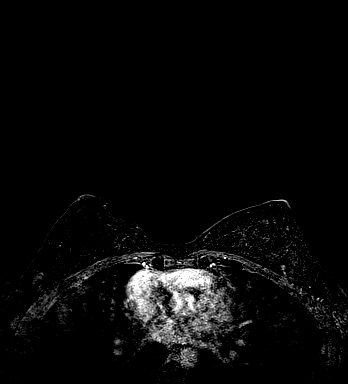
[im 86/144]
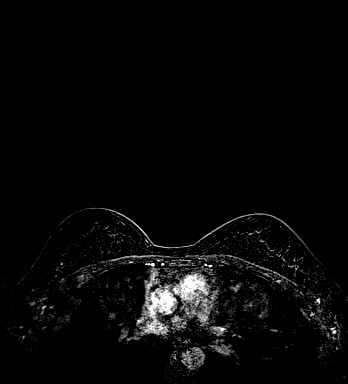
[im 115/144]
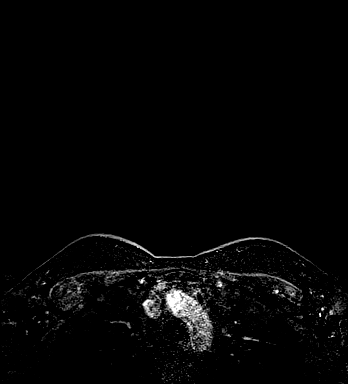
[im 144/144]
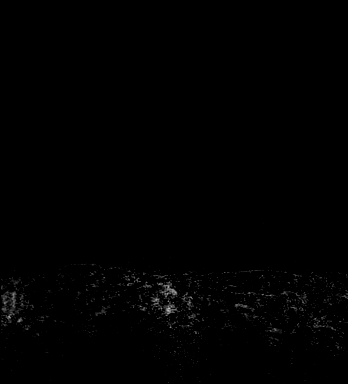

[Series 7: fl3d post-cm 20 · axial · 172.8mm · 0.89mm/px · 1 of 1 slices shown (3 of 3)]
[im 1/1]
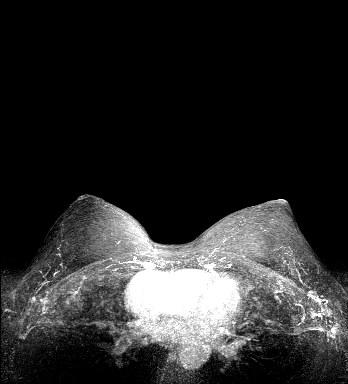

[Series 8: fl3d post-cm 3min · axial · 1.2mm · 0.89mm/px · z∈[-48,+123]mm · 6 of 144 slices shown]
[im 1/144]
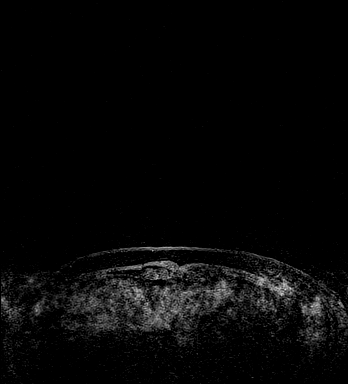
[im 29/144]
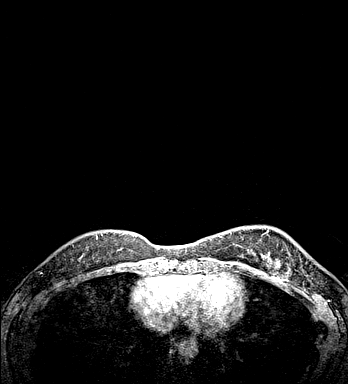
[im 58/144]
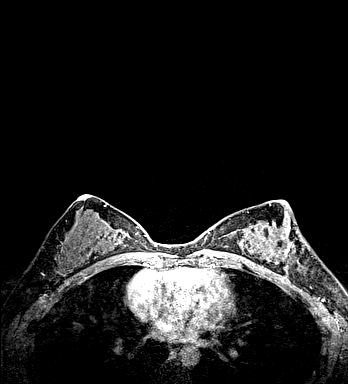
[im 86/144]
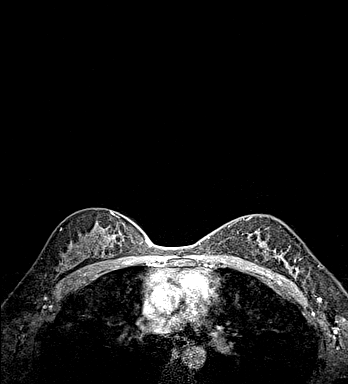
[im 115/144]
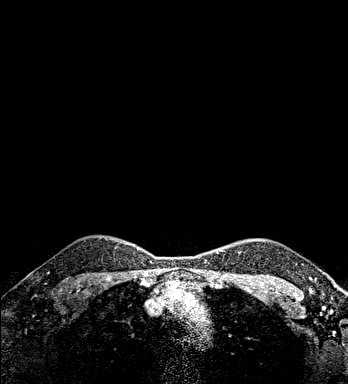
[im 144/144]
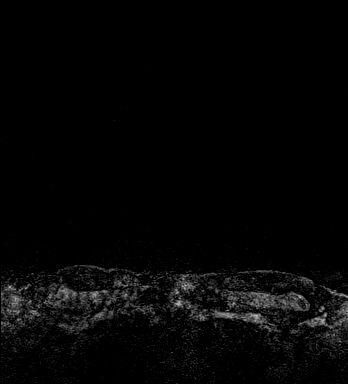

[Series 9: fl3d post-cm 3min_sub · axial · 1.2mm · 0.89mm/px · z∈[-48,+88]mm · 5 of 144 slices shown]
[im 1/144]
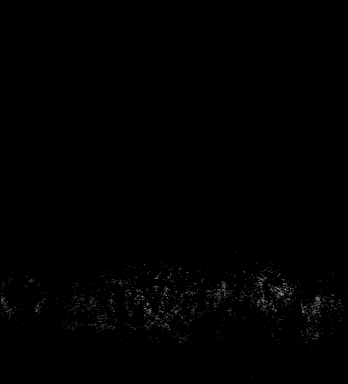
[im 29/144]
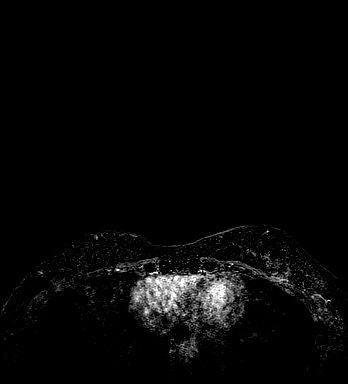
[im 58/144]
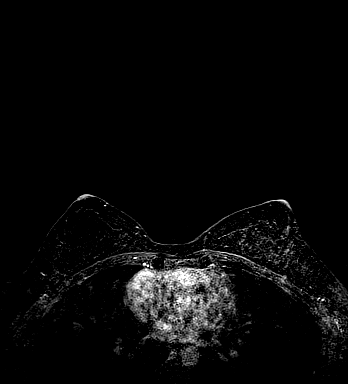
[im 86/144]
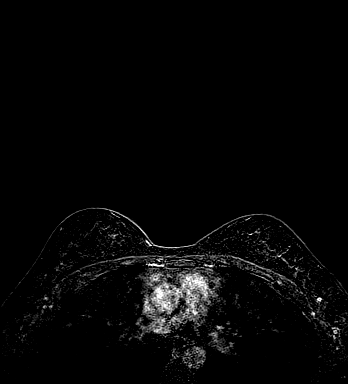
[im 115/144]
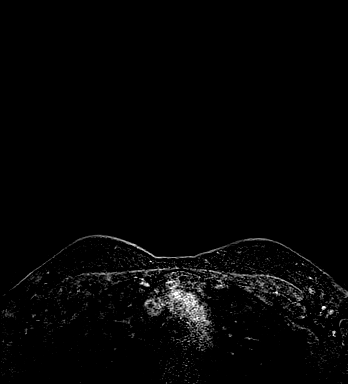

[34 of 48 positions shown; findings below may reference images not displayed]

Three-dimensional MR images were rendered by post-processing of the
original MR data on an independent workstation. The
three-dimensional MR images were interpreted, and findings are
reported in the following complete MRI report for this study. Three
dimensional images were evaluated at the independent [employer]
workstation
FINDINGS: Breast composition: d. Extreme fibroglandular tissue.

Background parenchymal enhancement: Minimal.

Right breast: No mass or abnormal enhancement.

Left breast: No mass or abnormal enhancement. Postoperative changes
noted along the lateral aspect.

Lymph nodes: No abnormal appearing lymph nodes.

Ancillary findings:  None.
IMPRESSION: 1. No MRI evidence of malignancy in either breast.
2. Left breast posttreatment changes.

RECOMMENDATION:
Routine screening in 1 year.

BI-RADS CATEGORY  2: Benign.

ADDENDUM:
RECOMMENDATION:
1. Bilateral diagnostic mammogram in 1 year.
2. Screening breast MRI in 1 year.

*** End of Addendum ***
Three-dimensional MR images were rendered by post-processing of the
original MR data on an independent workstation. The
three-dimensional MR images were interpreted, and findings are
reported in the following complete MRI report for this study. Three
dimensional images were evaluated at the independent [employer]
workstation
FINDINGS: Breast composition: d. Extreme fibroglandular tissue.

Background parenchymal enhancement: Minimal.

Right breast: No mass or abnormal enhancement.

Left breast: No mass or abnormal enhancement. Postoperative changes
noted along the lateral aspect.

Lymph nodes: No abnormal appearing lymph nodes.

Ancillary findings:  None.
IMPRESSION: 1. No MRI evidence of malignancy in either breast.
2. Left breast posttreatment changes.

RECOMMENDATION:
Routine screening in 1 year.

BI-RADS CATEGORY  2: Benign.

## 2019-04-26 MED ORDER — GADOBUTROL 1 MMOL/ML IV SOLN
5.0000 mL | Freq: Once | INTRAVENOUS | Status: AC | PRN
  Administered 2019-04-26: 5 mL via INTRAVENOUS

## 2019-04-30 ENCOUNTER — Other Ambulatory Visit: Payer: Self-pay | Admitting: Obstetrics and Gynecology

## 2019-04-30 DIAGNOSIS — R921 Mammographic calcification found on diagnostic imaging of breast: Secondary | ICD-10-CM

## 2019-05-08 ENCOUNTER — Telehealth: Payer: Self-pay | Admitting: Family Medicine

## 2019-05-08 DIAGNOSIS — F5101 Primary insomnia: Secondary | ICD-10-CM

## 2019-05-09 ENCOUNTER — Encounter (INDEPENDENT_AMBULATORY_CARE_PROVIDER_SITE_OTHER): Payer: Self-pay | Admitting: Family Medicine

## 2019-05-09 NOTE — Telephone Encounter (Signed)
Rx not on current med list. Pt should contact the office regarding refill.

## 2019-05-09 NOTE — Telephone Encounter (Signed)
Please review

## 2019-05-10 ENCOUNTER — Encounter: Payer: Self-pay | Admitting: Family Medicine

## 2019-05-10 NOTE — Telephone Encounter (Signed)
Pt calling back. Pt would like a call back from the nurse. Pt states that she was prescribed this medication the last time she had an appointment with Dr Juleen China. Please advise

## 2019-05-10 NOTE — Telephone Encounter (Signed)
See note

## 2019-05-13 ENCOUNTER — Other Ambulatory Visit: Payer: Self-pay | Admitting: Family Medicine

## 2019-05-13 MED ORDER — ESZOPICLONE 2 MG PO TABS: 2.0000 mg | ORAL_TABLET | Freq: Every evening | ORAL | 0 refills | Status: DC | PRN

## 2019-05-13 NOTE — Telephone Encounter (Signed)
Error

## 2019-06-04 ENCOUNTER — Other Ambulatory Visit: Payer: Self-pay | Admitting: Family Medicine

## 2019-06-05 ENCOUNTER — Encounter: Payer: Self-pay | Admitting: Family Medicine

## 2019-06-05 MED ORDER — ESZOPICLONE 2 MG PO TABS: 2.0000 mg | ORAL_TABLET | Freq: Every evening | ORAL | 0 refills | Status: DC | PRN

## 2019-06-05 NOTE — Telephone Encounter (Signed)
Last OV:  03/26/2019 (acute visit) Next OV:  None scheduled Last Fill per PMP:  05/13/2019  Please advise.

## 2019-06-06 ENCOUNTER — Encounter: Payer: Self-pay | Admitting: Hematology and Oncology

## 2019-06-15 ENCOUNTER — Other Ambulatory Visit: Payer: Self-pay | Admitting: Hematology and Oncology

## 2019-06-25 ENCOUNTER — Other Ambulatory Visit: Payer: Self-pay

## 2019-06-26 ENCOUNTER — Ambulatory Visit (INDEPENDENT_AMBULATORY_CARE_PROVIDER_SITE_OTHER): Payer: 59 | Admitting: Family Medicine

## 2019-06-26 ENCOUNTER — Encounter: Payer: Self-pay | Admitting: Family Medicine

## 2019-06-26 VITALS — BP 124/68 | HR 97 | Temp 97.7°F | Ht 66.0 in | Wt 135.4 lb

## 2019-06-26 DIAGNOSIS — Z23 Encounter for immunization: Secondary | ICD-10-CM | POA: Diagnosis not present

## 2019-06-26 DIAGNOSIS — R232 Flushing: Secondary | ICD-10-CM | POA: Diagnosis not present

## 2019-06-26 DIAGNOSIS — H9312 Tinnitus, left ear: Secondary | ICD-10-CM

## 2019-06-26 DIAGNOSIS — T451X5A Adverse effect of antineoplastic and immunosuppressive drugs, initial encounter: Secondary | ICD-10-CM

## 2019-06-26 DIAGNOSIS — R519 Headache, unspecified: Secondary | ICD-10-CM | POA: Diagnosis not present

## 2019-06-26 DIAGNOSIS — K5903 Drug induced constipation: Secondary | ICD-10-CM

## 2019-06-26 DIAGNOSIS — C50412 Malignant neoplasm of upper-outer quadrant of left female breast: Secondary | ICD-10-CM | POA: Diagnosis not present

## 2019-06-26 DIAGNOSIS — Z170 Estrogen receptor positive status [ER+]: Secondary | ICD-10-CM

## 2019-06-26 NOTE — Progress Notes (Signed)
Subjective  CC:  Chief Complaint  Patient presents with  . Transitions Of Care    transitioning care form Dr. Juleen China  . Blood In Stools    constipation    HPI: Andrea Beltran is a 34 y.o. female who presents to [facility] today to establish care with me as a new patient.  TOC appt: reviewed chart in detail. Has oncologist. Updated problem list.  Due for cpe in near future. Has ob gyn. Requested records . She has the following concerns or needs:  Headaches: gets about once a month; typically frontal but can be unilateral. Had "bad one" over christmas time. Typically uses an otc medication that resolves the headache but last headache was not relieved. No neuro deficits. No n/v photophobia or paresis. Headaches started about a year ago - same time as starting tamoxifen. Thinks it is another side effect.   Tinnitus in left ear; intermittent, started 2 weeks ago. No hearing loss or ear pain. No trauma to ear. She questions if it is related to headache.   Breast cancer tx now on tamoxifen. Hot flushes persist.  Constipation at times. Reports small amount of red blood on TP with wiping last week. No pain with defecation. Hard firm but brown stools.   HM: due tdap.   Assessment  1. Tinnitus of left ear   2. Generalized headaches   3. Malignant neoplasm of upper-outer quadrant of left breast in female, estrogen receptor positive ([place])   4. Hot flashes due to tamoxifen   5. Drug induced constipation   6. Need for Tdap vaccination      Plan   Tinnitus: new. Nl hearing screen. Unlikely related to headache. Will monitor. See AVS  Headaches: ? Side effects from meds. No red flags. Atypical for classic migraines. rec advil and monitoring.   Breast cancer and tamoxifen; tolerating side effects but difficult. Emotionally doing well  tdap updated today.   Follow up:  Return in about 3 months (around 09/24/2019) for complete physical. Orders Placed This  Encounter  Procedures  . Tdap vaccine greater than or equal to 7yo IM   No orders of the defined types were placed in this encounter.    Depression screen PHQ 2/9 06/26/2019  Decreased Interest 0  Down, Depressed, Hopeless 1  PHQ - 2 Score 1  Altered sleeping 3  Tired, decreased energy 3  Change in appetite 0  Feeling bad or failure about yourself  1  Trouble concentrating 0  Moving slowly or fidgety/restless 0  Suicidal thoughts 0  PHQ-9 Score 8  Difficult doing work/chores Not difficult at all    We updated and reviewed the patient's past history in detail and it is documented below.  Patient Active Problem List   Diagnosis Date Noted  . Hot flashes due to tamoxifen 11/09/2018    Priority: High  . Malignant neoplasm of upper-outer quadrant of left breast in female, estrogen receptor positive ([place]) 07/30/2018    Priority: High  . Family history of breast cancer     Priority: Medium    Maternal grandmother   . Family history of kidney cancer     Priority: Medium  . Family history of brain cancer     Priority: Medium    Paternal uncle    . Insomnia 02/10/2017    Priority: Medium  . Seasonal allergic rhinitis 02/10/2017    Priority: Low    Last Assessment & Plan:  Begin astelin nasal spray.  Advised may utilize Claritin safely during pregnancy. May utilize allegra as previously utilized, but rec discontinuing if knowledge of pregnancy.   . Vitamin D deficiency 02/10/2017    Priority: Low    Last Assessment & Plan:  Obtain vitamin D levels today. continueu current daily supplementation pending results review.    Health Maintenance  Topic Date Due  . HIV Screening  07/19/2001  . PAP SMEAR-Modifier  03/20/2024  . TETANUS/TDAP  06/25/2029  . INFLUENZA VACCINE  Completed   Immunization History  Administered Date(s) Administered  . Influenza,inj,Quad PF,6+ Mos 03/26/2019  . Tdap 06/26/2019   Current Meds  Medication Sig  . eszopiclone (LUNESTA) 2 MG TABS tablet  Take 1 tablet (2 mg total) by mouth at bedtime as needed for sleep. Take immediately before bedtime  . fluticasone (FLONASE) 50 MCG/ACT nasal spray Place 2 sprays into both nostrils daily.  . tamoxifen (NOLVADEX) 20 MG tablet TAKE 1 TABLET(20 MG) BY MOUTH DAILY    Allergies: Patient is allergic to cat hair extract and pollen extract. Past Medical History Patient  has a past medical history of Breast cancer ([place]), Family history of brain cancer, Family history of breast cancer, Family history of kidney cancer, and Personal history of radiation therapy (2019). Past Surgical History Patient  has a past surgical history that includes Breast lumpectomy (Left, 2019). Family History: Patient family history includes Breast cancer in her maternal grandmother. Social History:  Patient  reports that she has never smoked. She has never used smokeless tobacco.  Review of Systems: Constitutional: negative for fever or malaise Allergic/Immunologic: negative for hives GI: no abdominal pain  Patient Care Team    Relationship Specialty Notifications Start End  Leamon Arnt, MD PCP - General Family Medicine  06/26/19   Nicholas Lose, MD Consulting Physician Hematology and Oncology  10/24/18     Objective  Vitals: BP 124/68 (BP Location: Right Arm, Patient Position: Sitting, Cuff Size: Normal)   Pulse 97   Temp 97.7 F (36.5 C) (Temporal)   Ht 5\' 6"  (1.676 m)   Wt 135 lb 6.4 oz (61.4 kg)   LMP 09/17/2018 (Approximate)   SpO2 97%   BMI 21.85 kg/m  General:  Well developed, well nourished, no acute distress  Psych:  Alert and oriented,normal mood and affect HEENT:  Normocephalic, atraumatic, non-icteric sclera, PERRL, nl TMS bilaterally Cardiovascular:  RRR without gallop, rub or murmur, nondisplaced PMI Respiratory:  Good breath sounds bilaterally, CTAB with normal respiratory effort    Commons side effects, risks, benefits, and alternatives for medications and treatment plan prescribed today  were discussed, and the patient expressed understanding of the given instructions. Patient is instructed to call or message via MyChart if he/she has any questions or concerns regarding our treatment plan. No barriers to understanding were identified. We discussed Red Flag symptoms and signs in detail. Patient expressed understanding regarding what to do in case of urgent or emergency type symptoms.   Medication list was reconciled, printed and provided to the patient in AVS. Patient instructions and summary information was reviewed with the patient as documented in the AVS. This note was prepared with assistance of Dragon voice recognition software. Occasional wrong-word or sound-a-like substitutions may have occurred due to the inherent limitations of voice recognition software  This visit occurred during the SARS-CoV-2 public health emergency.  Safety protocols were in place, including screening questions prior to the visit, additional usage of staff PPE, and extensive cleaning of exam room while observing appropriate contact time  as indicated for disinfecting solutions.

## 2019-06-26 NOTE — Patient Instructions (Signed)
Please return in April for your annual complete physical; please come fasting.  Try advil as needed for you occasional headache.   If you have any questions or concerns, please don't hesitate to send me a message via MyChart or call the office at [phone]. Thank you for visiting with Andrea Beltran today! It's our pleasure caring for you.   Tinnitus Tinnitus refers to hearing a sound when there is no actual source for that sound. This is often described as ringing in the ears. However, people with this condition may hear a variety of noises, in one ear or in both ears. The sounds of tinnitus can be soft, loud, or somewhere in between. Tinnitus can last for a few seconds or can be constant for days. It may go away without treatment and come back at various times. When tinnitus is constant or happens often, it can lead to other problems, such as trouble sleeping and trouble concentrating. Almost everyone experiences tinnitus at some point. Tinnitus that is long-lasting (chronic) or comes back often (recurs) may require medical attention. What are the causes? The cause of tinnitus is often not known. In some cases, it can result from:  Exposure to loud noises from machinery, music, or other sources.  An object (foreign body) stuck in the ear.  Earwax buildup.  Drinking alcohol or caffeine.  Taking certain medicines.  Age-related hearing loss. It may also be caused by medical conditions such as:  Ear or sinus infections.  High blood pressure.  Heart diseases.  Anemia.  Allergies.  Meniere's disease.  Thyroid problems.  Tumors.  A weak, bulging blood vessel (aneurysm) near the ear. What are the signs or symptoms? The main symptom of tinnitus is hearing a sound when there is no source for that sound. It may sound like:  Buzzing.  Roaring.  Ringing.  Blowing air.  Hissing.  Whistling.  Sizzling.  Humming.  Running water.  A musical note.  Tapping. Symptoms may  affect only one ear (unilateral) or both ears (bilateral). How is this diagnosed? Tinnitus is diagnosed based on your symptoms, your medical history, and a physical exam. Your health care provider may do a thorough hearing test (audiologic exam) if your tinnitus:  Is unilateral.  Causes hearing difficulties.  Lasts 6 months or longer. You may work with a health care provider who specializes in hearing disorders (audiologist). You may be asked questions about your symptoms and how they affect your daily life. You may have other tests done, such as:  CT scan.  MRI.  An imaging test of how blood flows through your blood vessels (angiogram). How is this treated? Treating an underlying medical condition can sometimes make tinnitus go away. If your tinnitus continues, other treatments may include:  Medicines.  Therapy and counseling to help you manage the stress of living with tinnitus.  Sound generators to mask the tinnitus. These include: ? Tabletop sound machines that play relaxing sounds to help you fall asleep. ? Wearable devices that fit in your ear and play sounds or music. ? Acoustic neural stimulation. This involves using headphones to listen to music that contains an auditory signal. Over time, listening to this signal may change some pathways in your brain and make you less sensitive to tinnitus. This treatment is used for very severe cases when no other treatment is working.  Using hearing aids or cochlear implants if your tinnitus is related to hearing loss. Hearing aids are worn in the outer ear. Cochlear implants are surgically placed  in the inner ear. Follow these instructions at home: Managing symptoms      When possible, avoid being in loud places and being exposed to loud sounds.  Wear hearing protection, such as earplugs, when you are exposed to loud noises.  Use a white noise machine, a humidifier, or other devices to mask the sound of tinnitus.  Practice  techniques for reducing stress, such as meditation, yoga, or deep breathing. Work with your health care provider if you need help with managing stress.  Sleep with your head slightly raised. This may reduce the impact of tinnitus. General instructions  Do not use stimulants, such as nicotine, alcohol, or caffeine. Talk with your health care provider about other stimulants to avoid. Stimulants are substances that can make you feel alert and attentive by increasing certain activities in the body (such as heart rate and blood pressure). These substances may make tinnitus worse.  Take over-the-counter and prescription medicines only as told by your health care provider.  Try to get plenty of sleep each night.  Keep all follow-up visits as told by your health care provider. This is important. Contact a health care provider if:  Your tinnitus continues for 3 weeks or longer without stopping.  You develop sudden hearing loss.  Your symptoms get worse or do not get better with home care.  You feel you are not able to manage the stress of living with tinnitus. Get help right away if:  You develop tinnitus after a head injury.  You have tinnitus along with any of the following: ? Dizziness. ? Loss of balance. ? Nausea and vomiting. ? Sudden, severe headache. These symptoms may represent a serious problem that is an emergency. Do not wait to see if the symptoms will go away. Get medical help right away. Call your local emergency services (911 in the [place] Do not drive yourself to the hospital. Summary  Tinnitus refers to hearing a sound when there is no actual source for that sound. This is often described as ringing in the ears.  Symptoms may affect only one ear (unilateral) or both ears (bilateral).  Use a white noise machine, a humidifier, or other devices to mask the sound of tinnitus.  Do not use stimulants, such as nicotine, alcohol, or caffeine. Talk with your health care provider  about other stimulants to avoid. These substances may make tinnitus worse. This information is not intended to replace advice given to you by your health care provider. Make sure you discuss any questions you have with your health care provider. Document Revised: 12/19/2018 Document Reviewed: 03/16/2017 Elsevier Patient Education  2020 [employer].

## 2019-06-28 ENCOUNTER — Encounter: Payer: Self-pay | Admitting: Hematology and Oncology

## 2019-07-10 ENCOUNTER — Other Ambulatory Visit: Payer: Self-pay | Admitting: Family Medicine

## 2019-07-26 ENCOUNTER — Encounter: Payer: Self-pay | Admitting: Hematology and Oncology

## 2019-07-30 ENCOUNTER — Encounter: Payer: Self-pay | Admitting: Adult Health

## 2019-07-30 ENCOUNTER — Inpatient Hospital Stay: Payer: 59 | Attending: Adult Health | Admitting: Adult Health

## 2019-07-30 DIAGNOSIS — Z170 Estrogen receptor positive status [ER+]: Secondary | ICD-10-CM | POA: Diagnosis not present

## 2019-07-30 DIAGNOSIS — C50412 Malignant neoplasm of upper-outer quadrant of left female breast: Secondary | ICD-10-CM | POA: Diagnosis not present

## 2019-07-30 MED ORDER — TAMOXIFEN CITRATE 10 MG PO TABS: 10.0000 mg | ORAL_TABLET | Freq: Every day | ORAL | 3 refills | Status: DC

## 2019-07-30 NOTE — Progress Notes (Signed)
CLINIC:  Survivorship   REASON FOR VISIT:  Routine follow-up for history of breast cancer.   BRIEF ONCOLOGIC HISTORY:  Oncology History  Malignant neoplasm of upper-outer quadrant of left breast in female, estrogen receptor positive ([place])  03/29/2018 Surgery   Left lumpectomy at [facility] [place]: IDC grade 2, 1.8 x 1.6 x 1 cm, lymphovascular invasion present focally, 0/2 lymph nodes negative, ER positive, PR positive, HER-2 negative IHC 0, T1CN0 stage Ia pathologic stage   04/23/2018 Oncotype testing   Recurrent score: 16, distant recurrence of 9 years: 4%   05/03/2018 - 05/30/2018 Radiation Therapy   Adjuvant radiation therapy in [place] (4256 cGy/16 boost 10 Gy/4)   06/04/2018 -  Anti-estrogen oral therapy   Adjuvant tamoxifen 20 mg daily      INTERVAL HISTORY:  Andrea Beltran presents to the [facility] today for routine follow-up for her history of breast cancer.  Overall, she reports feeling quite well. She continues on Tamoxifen daily and is still having hot flashes.  She is frequently sweating due tot his.  She continues on Lunesta for sleep.  She has changed to one tab every other day and this has helped.  She hasn't had any further menstrual.     REVIEW OF SYSTEMS:  Review of Systems  Constitutional: Negative for appetite change, chills, fatigue, fever and unexpected weight change.  HENT:   Negative for hearing loss and lump/mass.   Eyes: Negative for eye problems and icterus.  Respiratory: Negative for chest tightness, cough and shortness of breath.   Cardiovascular: Negative for chest pain, leg swelling and palpitations.  Gastrointestinal: Negative for abdominal distention, abdominal pain, constipation, diarrhea, nausea and vomiting.  Endocrine: Positive for hot flashes.  Musculoskeletal: Negative for arthralgias.  Skin: Negative for itching and rash.  Neurological: Negative for dizziness, extremity weakness, headaches and numbness.    Hematological: Negative for adenopathy. Does not bruise/bleed easily.  Psychiatric/Behavioral: Negative for depression. The patient is not nervous/anxious.   Breast: Denies any new nodularity, masses, tenderness, nipple changes, or nipple discharge.       PAST MEDICAL/SURGICAL HISTORY:  Past Medical History:  Diagnosis Date  . Breast cancer ([place])   . Family history of brain cancer   . Family history of breast cancer   . Family history of kidney cancer   . Personal history of radiation therapy 2019   Past Surgical History:  Procedure Laterality Date  . BREAST LUMPECTOMY Left 2019     ALLERGIES:  Allergies  Allergen Reactions  . Cat Hair Extract Other (See Comments)    sneezing sneezing   . Pollen Extract Other (See Comments)    Sneezing      CURRENT MEDICATIONS:  Outpatient Encounter Medications as of 07/30/2019  Medication Sig  . eszopiclone (LUNESTA) 2 MG TABS tablet TAKE 1 TABLET(2 MG TOTAL) BY MOUTH AT BEDTIME( IMMEDIATELY BEFORE BEDTIME) AS NEEDED FOR SLEEP  . fluticasone (FLONASE) 50 MCG/ACT nasal spray Place 2 sprays into both nostrils daily.  . tamoxifen (NOLVADEX) 10 MG tablet Take 1 tablet (10 mg total) by mouth daily.  . [DISCONTINUED] tamoxifen (NOLVADEX) 20 MG tablet TAKE 1 TABLET(20 MG) BY MOUTH DAILY   No facility-administered encounter medications on file as of 07/30/2019.     ONCOLOGIC FAMILY HISTORY:  Family History  Problem Relation Age of Onset  . Breast cancer Maternal Grandmother     GENETIC COUNSELING/TESTING: See above  SOCIAL HISTORY:  Andrea Beltran quit her job last August due to increased  stress. 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  She is enjoying this.  She lives in [place], [place] with her husband and her cat Kipu.  She is exercising regularly at home.     PHYSICAL EXAMINATION:  Patient appears well. She is in no apparent distress.  Mood and behavior are normal. Skin visualized without rash or lesion.  Breathing is  non labored.     LABORATORY DATA:  None for this visit   DIAGNOSTIC IMAGING:  Most recent mammogram:  CLINICAL DATA:  34 year old patient with history left breast cancer [place] The patient is asymptomatic.  EXAM: DIGITAL DIAGNOSTIC BILATERAL MAMMOGRAM WITH CAD AND TOMO  COMPARISON:  Previous exam(s).  ACR Breast Density Category d: The breast tissue is extremely dense, which lowers the sensitivity of mammography.  FINDINGS: Magnification view the upper outer right breast shows an even more benign appearance of coalescent dystrophic calcification(s) with a lucent center. No suspicious microcalcification, mass, or architectural distortion in the right breast.  Stable appearance of the lumpectomy site in the upper outer left breast. No mass, nonsurgical distortion, or suspicious microcalcification in the left breast.  Mammographic images were processed with CAD.  IMPRESSION: Technique changes in the left breast.  Benign dystrophic calcification in the right breast.  No evidence of malignancy in either breast.  RECOMMENDATION: Diagnostic mammogram is suggested in 1 year. (Code:[id number]-B-01Y)  I have discussed the findings and recommendations with the patient. If applicable, a reminder letter will be sent to the patient regarding the next appointment.  BI-RADS CATEGORY  2: Benign.   Electronically Signed   By: Curlene Dolphin M.D.   On: 04/22/2019 16:21  CLINICAL DATA:  34 year old female with personal history of left breast cancer status post lumpectomy and radiation in 2019. Intermittent pain in the left breast.  LABS:  None performed today and site.  EXAM: BILATERAL BREAST MRI WITH AND WITHOUT CONTRAST  TECHNIQUE: Multiplanar, multisequence MR images of both breasts were obtained prior to and following the intravenous administration of 5 ml of Gadavist.  Three-dimensional MR images were  rendered by post-processing of the original MR data on an independent workstation. The three-dimensional MR images were interpreted, and findings are reported in the following complete MRI report for this study. Three dimensional images were evaluated at the independent DynaCad workstation  COMPARISON:  Previous exam(s).  FINDINGS: Breast composition: d. Extreme fibroglandular tissue.  Background parenchymal enhancement: Minimal.  Right breast: No mass or abnormal enhancement.  Left breast: No mass or abnormal enhancement. Postoperative changes noted along the lateral aspect.  Lymph nodes: No abnormal appearing lymph nodes.  Ancillary findings:  None.  IMPRESSION: 1. No MRI evidence of malignancy in either breast. 2. Left breast posttreatment changes.  RECOMMENDATION: Routine screening in 1 year.  BI-RADS CATEGORY  2: Benign.  Electronically Signed: By: Kristopher Oppenheim M.D. On: 04/29/2019 08:36    ASSESSMENT AND PLAN:  Andrea Beltran is a pleasant 34 y.o. female with history of Stage IA left breast invasive ductal carcinoma, ER+/PR+/HER2-, diagnosed in 02/2018, treated with lumpectomy, adjuvant radiation therapy, and anti-estrogen therapy with Tamoxifen beginning in 05/2018.  She [facility] for surveillance and routine follow-up.   1. History of breast cancer:  Andrea Beltran is currently clinically and radiographically without evidence of disease or recurrence of breast cancer. She will be due for mammogram in 04/2020.  She has very dense breasts and will have MRI alternating  with mammogram, next due in 10/2020.  She will continue her anti-estrogen therapy with Tamoxifen, with plans to continue for 10 years.  She will return to the cancer center to see her medical oncologist, Dr. Lindi Adie, in 6 months.  I encouraged her to call me with any questions or concerns before her next visit at the cancer center, and I would be happy to see her sooner, if needed.     2. Hot flashes: Reviewed non pharmacologic interventions that may help.  She does not want to take medication at this point.  3. Bone health:  She was given education on specific food and activities to promote bone health.  4. Cancer screening:  Due to Andrea Beltran history and her age, she should receive screening for skin cancers, and gynecologic cancers. She was encouraged to follow-up with her PCP for appropriate cancer screenings.   5. Health maintenance and wellness promotion: Andrea Beltran was encouraged to consume 5-7 servings of fruits and vegetables per day. She was also encouraged to engage in moderate to vigorous exercise for 30 minutes per day most days of the week. She was instructed to limit her alcohol consumption and continue to abstain from tobacco use.  Safe sun practice were reviewed as well.      Dispo:  -Return to cancer center in 6 months for f/u with Dr. Lindi Adie -Mammogram in 04/2020 -Breast MRI 10/2020 (FAST MRI)  Total encounter time: 30 minutes   Wilber Bihari, NP 07/30/19 11:15 AM Medical Oncology and Hematology [facility] [place], [place] [address] Tel. [phone]    Fax. [phone]  *Total Encounter Time as defined by the Centers for Medicare and Medicaid Services includes, in addition to the face-to-face time of a patient visit (documented in the note above) non-face-to-face time: obtaining and reviewing outside history, ordering and reviewing medications, tests or procedures, care coordination (communications with other health care professionals or caregivers) and documentation in the medical record.   Note: PRIMARY CARE PROVIDER Leamon Arnt, [place] [phone]

## 2019-08-01 ENCOUNTER — Telehealth: Payer: Self-pay | Admitting: Hematology and Oncology

## 2019-08-01 NOTE — Telephone Encounter (Signed)
I left a message regarding 8/9

## 2019-08-30 ENCOUNTER — Encounter: Payer: Self-pay | Admitting: Family Medicine

## 2019-09-03 ENCOUNTER — Other Ambulatory Visit: Payer: Self-pay

## 2019-09-03 ENCOUNTER — Encounter: Payer: Self-pay | Admitting: Family Medicine

## 2019-09-03 ENCOUNTER — Ambulatory Visit (INDEPENDENT_AMBULATORY_CARE_PROVIDER_SITE_OTHER): Payer: 59 | Admitting: Family Medicine

## 2019-09-03 VITALS — BP 132/70 | HR 99 | Temp 98.1°F | Ht 66.0 in | Wt 134.4 lb

## 2019-09-03 DIAGNOSIS — G4701 Insomnia due to medical condition: Secondary | ICD-10-CM

## 2019-09-03 DIAGNOSIS — F4322 Adjustment disorder with anxiety: Secondary | ICD-10-CM

## 2019-09-03 DIAGNOSIS — Z170 Estrogen receptor positive status [ER+]: Secondary | ICD-10-CM

## 2019-09-03 DIAGNOSIS — F418 Other specified anxiety disorders: Secondary | ICD-10-CM

## 2019-09-03 DIAGNOSIS — C50412 Malignant neoplasm of upper-outer quadrant of left female breast: Secondary | ICD-10-CM | POA: Diagnosis not present

## 2019-09-03 MED ORDER — ESCITALOPRAM OXALATE 10 MG PO TABS: 10.0000 mg | ORAL_TABLET | Freq: Every day | ORAL | 2 refills | Status: DC

## 2019-09-03 NOTE — Patient Instructions (Signed)
Please follow up as scheduled for your next visit with me: 09/13/2019   If you have any questions or concerns, please don't hesitate to send me a message via MyChart or call the office at [phone]. Thank you for visiting with [place] today! It's our pleasure caring for you.  We are starting a medication to help with your stress and anxiety today. Take once daily. It should slowly over time help how you feel.   Anxiety medications:  Taking the medicine as directed and not missing any doses is one of the best things you can do to treat your depression.  Here are some things to keep in mind:  1) Side effects (stomach upset, some increased anxiety) may happen before you notice a benefit.  These side effects typically go away over time. 2) Changes to your dose of medicine or a change in medication all together is sometimes necessary 3) Most people need to be on medication at least 6-12 months 4) Many people will notice an improvement within two weeks but the full effect of the medication can take up to 4-6 weeks 5) Stopping the medication when you start feeling better often results in a return of symptoms 6) If you start having thoughts of hurting yourself or others after starting this medicine, please call the office immediately at [phone].

## 2019-09-03 NOTE — Progress Notes (Signed)
Subjective  CC:  Chief Complaint  Patient presents with  . Adenopathy    swelling behind left ear. noticed last Wednesday. no pain. uncomfortable feeling    HPI: Andrea Beltran is a 34 y.o. female who presents to the office today to address the problems listed above in the chief complaint.  34 yo on treatment for breast cancer presents because she feels a swelling behind her left ear. Noted incidentally while massage her neck due to persistent trap and neck pain.   Headaches, poor sleep, worry about healthy and upper back and neck pain: trying to relax; yoga twice a day. Rare advil (will take just one pill when needed) but still has constant worry and muscle soreness. Admits to feeling stressed since dx. Using lunesta for secondary insomnia but still will awaken with worry and due to hot flushes from tamoxifen. She is tearful today. In past has had tension headaches and atypical chest pain ... likely both stress related as well. No h/o mood disorder. No panic sxs   Assessment  1. Adjustment reaction with anxiety   2. Malignant neoplasm of upper-outer quadrant of left breast in female, estrogen receptor positive ([place])   3. Insomnia due to medical condition   4. Anxiety about health      Plan   anxiety:  Counseling done. Reassured. She is palpating her skull, mastoid process that is a bit more prominent on the left. There are no nodules or LAD present. Discussed link between stress/worry and physical sxs including tension headaches, insomnia and upper back and neck pain from tight muscles. rec advil prn, heat, massage and discussed starting SSRI. Pt agrees. See AVS.   Follow up: No follow-ups on file.  09/13/2019  No orders of the defined types were placed in this encounter.  Meds ordered this encounter  Medications  . escitalopram (LEXAPRO) 10 MG tablet    Sig: Take 1 tablet (10 mg total) by mouth daily.    Dispense:  30 tablet    Refill:  2      I reviewed the patients updated  PMH, FH, and SocHx.    Patient Active Problem List   Diagnosis Date Noted  . Hot flashes due to tamoxifen 11/09/2018    Priority: High  . Malignant neoplasm of upper-outer quadrant of left breast in female, estrogen receptor positive ([place]) 07/30/2018    Priority: High  . Family history of breast cancer     Priority: Medium  . Family history of kidney cancer     Priority: Medium  . Family history of brain cancer     Priority: Medium  . Insomnia 02/10/2017    Priority: Medium  . Seasonal allergic rhinitis 02/10/2017    Priority: Low  . Vitamin D deficiency 02/10/2017    Priority: Low   Current Meds  Medication Sig  . eszopiclone (LUNESTA) 2 MG TABS tablet TAKE 1 TABLET(2 MG TOTAL) BY MOUTH AT BEDTIME( IMMEDIATELY BEFORE BEDTIME) AS NEEDED FOR SLEEP  . fluticasone (FLONASE) 50 MCG/ACT nasal spray Place 2 sprays into both nostrils daily.  . tamoxifen (NOLVADEX) 10 MG tablet Take 1 tablet (10 mg total) by mouth daily.    Allergies: Patient is allergic to cat hair extract and pollen extract. Family History: Patient family history includes Breast cancer in her maternal grandmother. Social History:  Patient  reports that she has never smoked. She has never used smokeless tobacco.  Review of Systems: Constitutional: Negative for fever malaise or anorexia Cardiovascular: negative for chest pain  Respiratory: negative for SOB or persistent cough Gastrointestinal: negative for abdominal pain  Objective  Vitals: BP 132/70 (BP Location: Left Arm, Patient Position: Sitting, Cuff Size: Normal)   Pulse 99   Temp 98.1 F (36.7 C) (Temporal)   Ht 5\' 6"  (1.676 m)   Wt 134 lb 6.4 oz (61 kg)   SpO2 99%   BMI 21.69 kg/m  General: no acute distress but tearful, A&Ox3 HEENT: PEERL, bilateral trap ttp, supple neck. No LAD. No nodules.      Commons side effects, risks, benefits, and alternatives for medications and treatment plan prescribed today were discussed, and the patient expressed  understanding of the given instructions. Patient is instructed to call or message via MyChart if he/she has any questions or concerns regarding our treatment plan. No barriers to understanding were identified. We discussed Red Flag symptoms and signs in detail. Patient expressed understanding regarding what to do in case of urgent or emergency type symptoms.   Medication list was reconciled, printed and provided to the patient in AVS. Patient instructions and summary information was reviewed with the patient as documented in the AVS. This note was prepared with assistance of Dragon voice recognition software. Occasional wrong-word or sound-a-like substitutions may have occurred due to the inherent limitations of voice recognition software  This visit occurred during the SARS-CoV-2 public health emergency.  Safety protocols were in place, including screening questions prior to the visit, additional usage of staff PPE, and extensive cleaning of exam room while observing appropriate contact time as indicated for disinfecting solutions.

## 2019-09-06 ENCOUNTER — Ambulatory Visit: Payer: 59 | Attending: Internal Medicine

## 2019-09-06 DIAGNOSIS — Z23 Encounter for immunization: Secondary | ICD-10-CM

## 2019-09-06 NOTE — Progress Notes (Signed)
   Covid-19 Vaccination Clinic  Name:  Andrea Beltran    MRN: [id number] DOB: 01-Sep-1985  09/06/2019  [name] was observed post Covid-19 immunization for 15 minutes without incident. She was provided with Vaccine Information Sheet and instruction to access the V-Safe system.   [name] was instructed to call 911 with any severe reactions post vaccine: Marland Kitchen Difficulty breathing  . Swelling of face and throat  . A fast heartbeat  . A bad rash all over body  . Dizziness and weakness   Immunizations Administered    Name Date Dose VIS Date Route   Pfizer COVID-19 Vaccine 09/06/2019  2:08 PM 0.3 mL 05/31/2019 Intramuscular   Manufacturer: [place]   Lot: [id number]   [place]: [id number]

## 2019-09-13 ENCOUNTER — Other Ambulatory Visit: Payer: Self-pay

## 2019-09-13 ENCOUNTER — Encounter: Payer: Self-pay | Admitting: Family Medicine

## 2019-09-13 ENCOUNTER — Ambulatory Visit: Payer: 59 | Admitting: Family Medicine

## 2019-09-13 VITALS — BP 122/70 | HR 77 | Temp 98.3°F | Ht 66.0 in | Wt 135.2 lb

## 2019-09-13 DIAGNOSIS — D224 Melanocytic nevi of scalp and neck: Secondary | ICD-10-CM

## 2019-09-13 DIAGNOSIS — C50412 Malignant neoplasm of upper-outer quadrant of left female breast: Secondary | ICD-10-CM | POA: Diagnosis not present

## 2019-09-13 DIAGNOSIS — Z0000 Encounter for general adult medical examination without abnormal findings: Secondary | ICD-10-CM

## 2019-09-13 DIAGNOSIS — Z170 Estrogen receptor positive status [ER+]: Secondary | ICD-10-CM

## 2019-09-13 DIAGNOSIS — R232 Flushing: Secondary | ICD-10-CM

## 2019-09-13 DIAGNOSIS — F4322 Adjustment disorder with anxiety: Secondary | ICD-10-CM | POA: Diagnosis not present

## 2019-09-13 DIAGNOSIS — T451X5A Adverse effect of antineoplastic and immunosuppressive drugs, initial encounter: Secondary | ICD-10-CM

## 2019-09-13 DIAGNOSIS — Z0001 Encounter for general adult medical examination with abnormal findings: Secondary | ICD-10-CM | POA: Diagnosis not present

## 2019-09-13 LAB — LIPID PANEL
Cholesterol: 153 mg/dL (ref 0–200)
HDL: 54 mg/dL (ref >39.00)
LDL Cholesterol: 81 mg/dL (ref 0–99)
NonHDL: 99.33
Total CHOL/HDL Ratio: 3
Triglycerides: 93 mg/dL (ref 0.0–149.0)
VLDL: 18.6 mg/dL (ref 0.0–40.0)

## 2019-09-13 LAB — COMPREHENSIVE METABOLIC PANEL
ALT: 14 U/L (ref 0–35)
AST: 23 U/L (ref 0–37)
Albumin: 4.3 g/dL (ref 3.5–5.2)
Alkaline Phosphatase: 46 U/L (ref 39–117)
BUN: 12 mg/dL (ref 6–23)
CO2: 29 mEq/L (ref 19–32)
Calcium: 9.4 mg/dL (ref 8.4–10.5)
Chloride: 104 mEq/L (ref 96–112)
Creatinine, Ser: 0.64 mg/dL (ref 0.40–1.20)
GFR: 106.77 mL/min (ref >60.00)
Glucose, Bld: 97 mg/dL (ref 70–99)
Potassium: 3.8 mEq/L (ref 3.5–5.1)
Sodium: 139 mEq/L (ref 135–145)
Total Bilirubin: 0.4 mg/dL (ref 0.2–1.2)
Total Protein: 8.1 g/dL (ref 6.0–8.3)

## 2019-09-13 LAB — CBC WITH DIFFERENTIAL/PLATELET
Basophils Absolute: 0 10*3/uL (ref 0.0–0.1)
Basophils Relative: 0.7 % (ref 0.0–3.0)
Eosinophils Absolute: 0.1 10*3/uL (ref 0.0–0.7)
Eosinophils Relative: 2.7 % (ref 0.0–5.0)
HCT: 38.8 % (ref 36.0–46.0)
Hemoglobin: 13 g/dL (ref 12.0–15.0)
Lymphocytes Relative: 46.4 % — ABNORMAL HIGH (ref 12.0–46.0)
Lymphs Abs: 2.2 10*3/uL (ref 0.7–4.0)
MCHC: 33.5 g/dL (ref 30.0–36.0)
MCV: 90.8 fl (ref 78.0–100.0)
Monocytes Absolute: 0.6 10*3/uL (ref 0.1–1.0)
Monocytes Relative: 13 % — ABNORMAL HIGH (ref 3.0–12.0)
Neutro Abs: 1.7 10*3/uL (ref 1.4–7.7)
Neutrophils Relative %: 37.2 % — ABNORMAL LOW (ref 43.0–77.0)
Platelets: 178 10*3/uL (ref 150.0–400.0)
RBC: 4.27 Mil/uL (ref 3.87–5.11)
RDW: 12.7 % (ref 11.5–15.5)
WBC: 4.7 10*3/uL (ref 4.0–10.5)

## 2019-09-13 LAB — TSH: TSH: 1.78 u[IU]/mL (ref 0.35–4.50)

## 2019-09-13 NOTE — Patient Instructions (Signed)
Please return in 12 months for your annual complete physical; please come fasting.  I will release your lab results to you on your MyChart account with further instructions. Please reply with any questions.  It may take a week to get the results. Don't worry!  If you have any questions or concerns, please don't hesitate to send me a message via MyChart or call the office at [phone]. Thank you for visiting with [place] today! It's our pleasure caring for you.  Please do these things to maintain good health!   Exercise at least 30-45 minutes a day,  4-5 days a week.   Eat a low-fat diet with lots of fruits and vegetables, up to 7-9 servings per day.  Drink plenty of water daily. Try to drink 8 8oz glasses per day.  Seatbelts can save your life. Always wear your seatbelt.  Place Smoke Detectors on every level of your home and check batteries every year.  Schedule an appointment with an eye doctor for an eye exam every 1-2 years  Safe sex - use condoms to protect yourself from STDs if you could be exposed to these types of infections. Use birth control if you do not want to become pregnant and are sexually active.  Avoid heavy alcohol use. If you drink, keep it to less than 2 drinks/day and not every day.  [place].  Choose someone you trust that could speak for you if you became unable to speak for yourself.  Depression is common in our stressful world.If you're feeling down or losing interest in things you normally enjoy, please come in for a visit.  If anyone is threatening or hurting you, please get help. Physical or Emotional Violence is never OK.

## 2019-09-13 NOTE — Progress Notes (Signed)
Subjective  Chief Complaint  Patient presents with  . Anxiety    anxious due to DX of breast cancer. feeling ok today  . Annual Exam  . Nevus    HPI: Andrea Beltran is a 34 y.o. female who presents to [facility] today for a Female Wellness Visit.  She also has the concerns and/or needs as listed above in the chief complaint. These will be addressed in addition to the Health Maintenance Visit.   Wellness Visit: annual visit with health maintenance review and exam without Pap   HM: up to date Chronic disease management visit and/or acute problem visit:  Anxiety: doing better. Doesn't feel like she actually needs medicines. Understands link between worry and stress/ is managing with increased exercise. Feels better after talking with me at last visit.   Irregular dark mole on neck - not sure how long it has been there. onc told her to watch for skin lesions. No bleeding.   Breast cancer on tamoxifen. Stable  Assessment  1. Annual physical exam   2. Adjustment reaction with anxiety   3. Atypical nevus of neck   4. Malignant neoplasm of upper-outer quadrant of left breast in female, estrogen receptor positive ([place])   5. Hot flashes due to tamoxifen      Plan  Female Wellness Visit:  Age appropriate Health Maintenance and Prevention measures were discussed with patient. Included topics are cancer screening recommendations, ways to keep healthy (see AVS) including dietary and exercise recommendations, regular eye and dental care, use of seat belts, and avoidance of moderate alcohol use and tobacco use.   BMI: discussed patient's BMI and encouraged positive lifestyle modifications to help get to or maintain a target BMI.  HM needs and immunizations were addressed and ordered. See below for orders. See HM and immunization section for updates.  Routine labs and screening tests ordered including cmp, cbc and lipids where appropriate.  Discussed  recommendations regarding Vit D and calcium supplementation (see AVS)  Chronic disease f/u and/or acute problem visit: (deemed necessary to be done in addition to the wellness visit):  Adjustment anxiety:  Continue counseling. Monitor. Improved  Atypical nevus: sp shave bx. Education given on biopsy site wound care. Await path. Reassured.  Breast cancer treatment per onc  Follow up: Return in about 1 year (around 09/12/2020) for complete physical.   Orders Placed This Encounter  Procedures  . CBC with Differential/Platelet  . Comprehensive metabolic panel  . Lipid panel  . TSH   No orders of the defined types were placed in this encounter.     Lifestyle: Body mass index is 21.82 kg/m. Wt Readings from Last 3 Encounters:  09/13/19 135 lb 3.2 oz (61.3 kg)  09/03/19 134 lb 6.4 oz (61 kg)  06/26/19 135 lb 6.4 oz (61.4 kg)     Patient Active Problem List   Diagnosis Date Noted  . Hot flashes due to tamoxifen 11/09/2018    Priority: High  . Malignant neoplasm of upper-outer quadrant of left breast in female, estrogen receptor positive ([place]) 07/30/2018    Priority: High  . Family history of breast cancer     Priority: Medium    Maternal grandmother   . Family history of kidney cancer     Priority: Medium  . Family history of brain cancer     Priority: Medium    Paternal uncle    . Insomnia 02/10/2017    Priority: Medium  . Seasonal allergic rhinitis  02/10/2017    Priority: Low    Last Assessment & Plan:  Begin astelin nasal spray. Advised may utilize Claritin safely during pregnancy. May utilize allegra as previously utilized, but rec discontinuing if knowledge of pregnancy.   . Vitamin D deficiency 02/10/2017    Priority: Low    Last Assessment & Plan:  Obtain vitamin D levels today. continueu current daily supplementation pending results review.    Health Maintenance  Topic Date Due  . PAP SMEAR-Modifier  03/20/2024  . TETANUS/TDAP  06/25/2029  . INFLUENZA  VACCINE  Completed  . HIV Screening  Discontinued   Immunization History  Administered Date(s) Administered  . Influenza,inj,Quad PF,6+ Mos 03/26/2019  . PFIZER SARS-COV-2 Vaccination 09/06/2019  . Tdap 06/26/2019   We updated and reviewed the patient's past history in detail and it is documented below. Allergies: Patient  has no history on file for alcohol. Past Medical History Patient  has a past medical history of Breast cancer ([place]), Family history of brain cancer, Family history of breast cancer, Family history of kidney cancer, and Personal history of radiation therapy (2019). Past Surgical History Patient  has a past surgical history that includes Breast lumpectomy (Left, 2019). Social History   Socioeconomic History  . Marital status: Married    Spouse name: Not on file  . Number of children: Not on file  . Years of education: Not on file  . Highest education level: Not on file  Occupational History    Employer: [employer]  Tobacco Use  . Smoking status: Never Smoker  . Smokeless tobacco: Never Used  Substance and Sexual Activity  . Alcohol use: Not on file  . Drug use: Not on file  . Sexual activity: Yes  Other Topics Concern  . Not on file  Social History Narrative  . Not on file   Social Determinants of Health   Financial Resource Strain:   . Difficulty of Paying Living Expenses:   Food Insecurity:   . Worried About Charity fundraiser in the Last Year:   . Arboriculturist in the Last Year:   Transportation Needs:   . Film/video editor (Medical):   Marland Kitchen Lack of Transportation (Non-Medical):   Physical Activity:   . Days of Exercise per Week:   . Minutes of Exercise per Session:   Stress:   . Feeling of Stress :   Social Connections:   . Frequency of Communication with Friends and Family:   . Frequency of Social Gatherings with Friends and Family:   . Attends Religious Services:   . Active Member of Clubs or Organizations:   . Attends Theatre manager Meetings:   Marland Kitchen Marital Status:    Family History  Problem Relation Age of Onset  . Breast cancer Maternal Grandmother     Review of Systems: Constitutional: negative for fever or malaise Ophthalmic: negative for photophobia, double vision or loss of vision Cardiovascular: negative for chest pain, dyspnea on exertion, or new LE swelling Respiratory: negative for SOB or persistent cough Gastrointestinal: negative for abdominal pain, change in bowel habits or melena Genitourinary: negative for dysuria or gross hematuria, no abnormal uterine bleeding or disharge Musculoskeletal: negative for new gait disturbance or muscular weakness Integumentary: negative for new or persistent rashes, no breast lumps Neurological: negative for TIA or stroke symptoms Psychiatric: negative for SI or delusions Allergic/Immunologic: negative for hives  Patient Care Team    Relationship Specialty Notifications Start End  Leamon Arnt, MD  PCP - General Family Medicine  06/26/19   Nicholas Lose, MD Consulting Physician Hematology and Oncology  10/24/18     Objective  Vitals: BP 122/70 (BP Location: Right Arm, Patient Position: Sitting, Cuff Size: Normal)   Pulse 77   Temp 98.3 F (36.8 C) (Temporal)   Ht 5\' 6"  (1.676 m)   Wt 135 lb 3.2 oz (61.3 kg)   SpO2 96%   BMI 21.82 kg/m  General:  Well developed, well nourished, no acute distress  Psych:  Alert and orientedx3,normal mood and affect HEENT:  Normocephalic, atraumatic, non-icteric sclera, PERRL, supple neck without adenopathy, mass or thyromegaly Cardiovascular:  Normal S1, S2, RRR without gallop, rub or murmur Respiratory:  Good breath sounds bilaterally, CTAB with normal respiratory effort Gastrointestinal: normal bowel sounds, soft, non-tender, no noted masses. No HSM MSK: no deformities, contusions. Joints are without erythema or swelling.  Skin:  Warm, no rashes, left neck with 23mm raised dark nevus with irregular border. No color  varieation Neurologic:    Mental status is normal. Gross motor and sensory exams are normal. Normal gait. No tremor  Shave Biopsy Procedure Note  Pre-operative Diagnosis: Dysplastic nevi  Post-operative Diagnosis: same  Locations:left neck  Indications: atypical, r/o atypia or malignancy  Anesthesia: Lidocaine 1% with epinephrine   Procedure Details   Patient informed of the risks (including bleeding and infection) and benefits of the  procedure and Verbal informed consent obtained.  The lesion and surrounding area were given a sterile prep using alcohol and draped in the usual sterile fashion. A scalpel was used to shave an area of skin approximately 63mm by 4mm.  Hemostasis achieved with alumuninum chloride. Antibiotic ointment and a sterile dressing applied.  The specimen was sent for pathologic examination. The patient tolerated the procedure well.  EBL: 0 ml  Findings: Atypical nevus  Condition: Stable  Complications: none.  Plan: 1. Instructed to keep the wound dry and covered for 24-48h and clean thereafter. 2. Warning signs of infection were reviewed.      Commons side effects, risks, benefits, and alternatives for medications and treatment plan prescribed today were discussed, and the patient expressed understanding of the given instructions. Patient is instructed to call or message via MyChart if he/she has any questions or concerns regarding our treatment plan. No barriers to understanding were identified. We discussed Red Flag symptoms and signs in detail. Patient expressed understanding regarding what to do in case of urgent or emergency type symptoms.   Medication list was reconciled, printed and provided to the patient in AVS. Patient instructions and summary information was reviewed with the patient as documented in the AVS. This note was prepared with assistance of Dragon voice recognition software. Occasional wrong-word or sound-a-like substitutions may have  occurred due to the inherent limitations of voice recognition software  This visit occurred during the SARS-CoV-2 public health emergency.  Safety protocols were in place, including screening questions prior to the visit, additional usage of staff PPE, and extensive cleaning of exam room while observing appropriate contact time as indicated for disinfecting solutions.

## 2019-10-01 ENCOUNTER — Ambulatory Visit: Payer: 59 | Attending: Internal Medicine

## 2019-10-01 DIAGNOSIS — Z23 Encounter for immunization: Secondary | ICD-10-CM

## 2019-10-01 NOTE — Progress Notes (Signed)
   Covid-19 Vaccination Clinic  Name:  Andrea Beltran    MRN: [id number] DOB: [birth year]  10/01/2019  [name] was observed post Covid-19 immunization for 15 minutes without incident. She was provided with Vaccine Information Sheet and instruction to access the V-Safe system.   [name] was instructed to call 911 with any severe reactions post vaccine: Marland Kitchen Difficulty breathing  . Swelling of face and throat  . A fast heartbeat  . A bad rash all over body  . Dizziness and weakness   Immunizations Administered    Name Date Dose VIS Date Route   Pfizer COVID-19 Vaccine 10/01/2019  2:56 PM 0.3 mL 05/31/2019 Intramuscular   Manufacturer: [place]   Lot: [id number]   [place]: [id number]

## 2019-11-05 ENCOUNTER — Telehealth: Payer: Self-pay | Admitting: Family Medicine

## 2019-11-05 NOTE — Telephone Encounter (Signed)
Ok with me. Thanks.  

## 2019-11-05 NOTE — Telephone Encounter (Signed)
Good Morning,  Patient is requesting TOC from [place] to [facility]. Please advise Thanks

## 2019-11-05 NOTE — Telephone Encounter (Signed)
I don't routinely prescribe Lunesta or other Z drugs/hypnotics like it so will either refer her to a specialist or try to transition to something else. If she is OK with this, I am happy to see her. If not, I am not sure we will be a good fit. Ty.

## 2019-11-06 NOTE — Telephone Encounter (Signed)
Patient decided not to come on as a new patient. And appt was canceled

## 2019-11-24 ENCOUNTER — Encounter: Payer: Self-pay | Admitting: Family Medicine

## 2019-11-25 ENCOUNTER — Encounter: Payer: 59 | Admitting: Family Medicine

## 2019-11-29 ENCOUNTER — Encounter: Payer: Self-pay | Admitting: Family Medicine

## 2019-11-29 ENCOUNTER — Ambulatory Visit: Payer: 59 | Admitting: Family Medicine

## 2019-11-29 ENCOUNTER — Other Ambulatory Visit: Payer: Self-pay

## 2019-11-29 ENCOUNTER — Telehealth: Payer: Self-pay | Admitting: Family Medicine

## 2019-11-29 VITALS — BP 124/60 | HR 85 | Temp 98.4°F | Resp 18 | Ht 66.0 in | Wt 135.4 lb

## 2019-11-29 DIAGNOSIS — S39012A Strain of muscle, fascia and tendon of lower back, initial encounter: Secondary | ICD-10-CM

## 2019-11-29 MED ORDER — DICLOFENAC SODIUM 75 MG PO TBEC: 75.0000 mg | DELAYED_RELEASE_TABLET | Freq: Two times a day (BID) | ORAL | 0 refills | Status: DC

## 2019-11-29 MED ORDER — CYCLOBENZAPRINE HCL 10 MG PO TABS: 10.0000 mg | ORAL_TABLET | Freq: Every evening | ORAL | 0 refills | Status: DC | PRN

## 2019-11-29 NOTE — Progress Notes (Signed)
Subjective  CC:  Chief Complaint  Patient presents with  . Muscle Pain    May 28th is when she started having some pain to her left hip that radiates down to the front of her thigh. She did some stretching and now her right side hurts as well. She has taken OTC ibuprofen 3 tablets every 4-6 hours.     HPI: Andrea Beltran is a 34 y.o. female who presents to the office today to address the problems listed above in the chief complaint.  2 weeks of left low back pain started after exercising. Pain with standing from seated position with some spasm. Reports pain in hips and legs as well w/o weakness or b/b dysfunction. advil helps. Used heating pad. Has had this type of pain once before but it resolved after a week. She has been resting but pain persists.   Assessment  1. Back strain, initial encounter      Plan   LBP:  Start nsaids and mm relaxer for mm spasm. Heat and stretching. F/u if persists or worsens.   Follow up: prn  Visit date not found  No orders of the defined types were placed in this encounter.  Meds ordered this encounter  Medications  . diclofenac (VOLTAREN) 75 MG EC tablet    Sig: Take 1 tablet (75 mg total) by mouth 2 (two) times daily.    Dispense:  30 tablet    Refill:  0  . cyclobenzaprine (FLEXERIL) 10 MG tablet    Sig: Take 1 tablet (10 mg total) by mouth at bedtime as needed for muscle spasms.    Dispense:  30 tablet    Refill:  0      I reviewed the patients updated PMH, FH, and SocHx.    Patient Active Problem List   Diagnosis Date Noted  . Hot flashes due to tamoxifen 11/09/2018    Priority: High  . Malignant neoplasm of upper-outer quadrant of left breast in female, estrogen receptor positive ([place]) 07/30/2018    Priority: High  . Family history of breast cancer     Priority: Medium  . Family history of kidney cancer     Priority: Medium  . Family history of brain cancer     Priority: Medium  . Insomnia 02/10/2017    Priority: Medium  .  Seasonal allergic rhinitis 02/10/2017    Priority: Low  . Vitamin D deficiency 02/10/2017    Priority: Low   Current Meds  Medication Sig  . eszopiclone (LUNESTA) 2 MG TABS tablet TAKE 1 TABLET(2 MG TOTAL) BY MOUTH AT BEDTIME( IMMEDIATELY BEFORE BEDTIME) AS NEEDED FOR SLEEP  . fluticasone (FLONASE) 50 MCG/ACT nasal spray Place 2 sprays into both nostrils daily.  . tamoxifen (NOLVADEX) 10 MG tablet Take 1 tablet (10 mg total) by mouth daily.    Allergies: Patient is allergic to cat hair extract and pollen extract. Family History: Patient family history includes Breast cancer in her maternal grandmother. Social History:  Patient  reports that she has never smoked. She has never used smokeless tobacco. She reports current alcohol use. She reports that she does not use drugs.  Review of Systems: Constitutional: Negative for fever malaise or anorexia Cardiovascular: negative for chest pain Respiratory: negative for SOB or persistent cough Gastrointestinal: negative for abdominal pain  Objective  Vitals: BP 124/60   Pulse 85   Temp 98.4 F (36.9 C) (Temporal)   Resp 18   Ht 5\' 6"  (1.676 m)   Wt 135  lb 6.4 oz (61.4 kg)   SpO2 97%   BMI 21.85 kg/m  General: gets up from seated position slowly, A&Ox3 Back: no ttp, limited extension due to spasm. Neg SLR B and nl DTRs. Antalgic gait     Commons side effects, risks, benefits, and alternatives for medications and treatment plan prescribed today were discussed, and the patient expressed understanding of the given instructions. Patient is instructed to call or message via MyChart if he/she has any questions or concerns regarding our treatment plan. No barriers to understanding were identified. We discussed Red Flag symptoms and signs in detail. Patient expressed understanding regarding what to do in case of urgent or emergency type symptoms.   Medication list was reconciled, printed and provided to the patient in AVS. Patient  instructions and summary information was reviewed with the patient as documented in the AVS. This note was prepared with assistance of Dragon voice recognition software. Occasional wrong-word or sound-a-like substitutions may have occurred due to the inherent limitations of voice recognition software  This visit occurred during the SARS-CoV-2 public health emergency.  Safety protocols were in place, including screening questions prior to the visit, additional usage of staff PPE, and extensive cleaning of exam room while observing appropriate contact time as indicated for disinfecting solutions.

## 2019-11-29 NOTE — Telephone Encounter (Signed)
Caller: Andrea Beltran Call back phone number:[phone]  Patient ask a TOC previously to Dr.Wendling once she discovers he was not comfortable prescribing Lunesta she decides not to change. She is wondering if Dr. Larose Kells would accept her as a patient and willing to prescribe medication.  Please advise.

## 2019-11-29 NOTE — Telephone Encounter (Signed)
Decline

## 2019-11-29 NOTE — Telephone Encounter (Signed)
If you will let Pt know that Dr. Larose Kells declines the transfer of care please.

## 2019-11-29 NOTE — Telephone Encounter (Signed)
Patient was made aware

## 2019-11-29 NOTE — Patient Instructions (Signed)
Please follow up if symptoms do not improve or as needed.    Lumbar Strain A lumbar strain, which is sometimes called a low-back strain, is a stretch or tear in a muscle or the strong cords of tissue that attach muscle to bone (tendons) in the lower back (lumbar spine). This type of injury occurs when muscles or tendons are torn or are stretched beyond their limits. Lumbar strains can range from mild to severe. Mild strains may involve stretching a muscle or tendon without tearing it. These may heal in 1-2 weeks. More severe strains involve tearing of muscle fibers or tendons. These will cause more pain and may take 6-8 weeks to heal. What are the causes? This condition may be caused by:  Trauma, such as a fall or a hit to the body.  Twisting or overstretching the back. This may result from doing activities that need a lot of energy, such as lifting heavy objects. What increases the risk? This injury is more common in:  Athletes.  People with obesity.  People who do repeated lifting, bending, or other movements that involve their back. What are the signs or symptoms? Symptoms of this condition may include:  Sharp or dull pain in the lower back that does not go away. The pain may extend to the buttocks.  Stiffness or limited range of motion.  Sudden muscle tightening (spasms). How is this diagnosed? This condition may be diagnosed based on:  Your symptoms.  Your medical history.  A physical exam.  Imaging tests, such as: ? X-rays. ? MRI. How is this treated? Treatment for this condition may include:  Rest.  Applying heat and cold to the affected area.  Over-the-counter medicines to help relieve pain and inflammation, such as NSAIDs.  Prescription pain medicine and muscle relaxants may be needed for a short time.  Physical therapy. Follow these instructions at home: Managing pain, stiffness, and swelling      If directed, put ice on the injured area during the  first 24 hours after your injury. ? Put ice in a plastic bag. ? Place a towel between your skin and the bag. ? Leave the ice on for 20 minutes, 2-3 times a day.  If directed, apply heat to the affected area as often as told by your health care provider. Use the heat source that your health care provider recommends, such as a moist heat pack or a heating pad. ? Place a towel between your skin and the heat source. ? Leave the heat on for 20-30 minutes. ? Remove the heat if your skin turns bright red. This is especially important if you are unable to feel pain, heat, or cold. You may have a greater risk of getting burned. Activity  Rest and return to your normal activities as told by your health care provider. Ask your health care provider what activities are safe for you.  Do exercises as told by your health care provider. Medicines  Take over-the-counter and prescription medicines only as told by your health care provider.  Ask your health care provider if the medicine prescribed to you: ? Requires you to avoid driving or using heavy machinery. ? Can cause constipation. You may need to take these actions to prevent or treat constipation:  Drink enough fluid to keep your urine pale yellow.  Take over-the-counter or prescription medicines.  Eat foods that are high in fiber, such as beans, whole grains, and fresh fruits and vegetables.  Limit foods that are high in  fat and processed sugars, such as fried or sweet foods. Injury prevention To prevent a future low-back injury:  Always warm up properly before physical activity or sports.  Cool down and stretch after being active.  Use correct form when playing sports and lifting heavy objects. Bend your knees before you lift heavy objects.  Use good posture when sitting and standing.  Stay physically fit and keep a healthy weight. ? Do at least 150 minutes of moderate-intensity exercise each week, such as brisk walking or water  aerobics. ? Do strength exercises at least 2 times each week.  General instructions  Do not use any products that contain nicotine or tobacco, such as cigarettes, e-cigarettes, and chewing tobacco. If you need help quitting, ask your health care provider.  Keep all follow-up visits as told by your health care provider. This is important. Contact a health care provider if:  Your back pain does not improve after 6 weeks of treatment.  Your symptoms get worse. Get help right away if:  Your back pain is severe.  You are unable to stand or walk.  You develop pain in your legs.  You develop weakness in your buttocks or legs.  You have difficulty controlling when you urinate or when you have a bowel movement. ? You have frequent, painful, or bloody urination. ? You have a temperature over 101.15F (38.3C) Summary  A lumbar strain, which is sometimes called a low-back strain, is a stretch or tear in a muscle or the strong cords of tissue that attach muscle to bone (tendons) in the lower back (lumbar spine).  This type of injury occurs when muscles or tendons are torn or are stretched beyond their limits.  Rest and return to your normal activities as told by your health care provider. If directed, apply heat and ice to the affected area as often as told by your health care provider.  Take over-the-counter and prescription medicines only as told by your health care provider.  Contact a health care provider if you have new or worsening symptoms. This information is not intended to replace advice given to you by your health care provider. Make sure you discuss any questions you have with your health care provider. Document Revised: 04/05/2018 Document Reviewed: 04/05/2018 Elsevier Patient Education  [place].

## 2019-11-29 NOTE — Telephone Encounter (Signed)
Please advise 

## 2019-12-05 ENCOUNTER — Telehealth: Payer: Self-pay | Admitting: Family Medicine

## 2019-12-05 NOTE — Telephone Encounter (Signed)
Patient wishes to [facility] from Dr. Jonni Sanger to Dr. Bryan Lemma because she lives closer to the office at [facility]. Please let me know if this is approved by both of you and I will call her to schedule.

## 2019-12-05 NOTE — Telephone Encounter (Signed)
Yes, this is fine with me.  

## 2019-12-05 NOTE — Telephone Encounter (Signed)
I reached out to the patient and left her a voicemail with this information and asked her to call us again if she would like me to check with Dr. Ethelene Hal or [place].

## 2019-12-05 NOTE — Telephone Encounter (Signed)
Due to large influx of new patients and 2 providers leaving and me taking on a majority of those patients, I am not able to accommodate the patient at this time. Dr. Ethelene Hal or Wilfred Lacy may be able to do so.

## 2019-12-11 ENCOUNTER — Other Ambulatory Visit: Payer: Self-pay | Admitting: Family Medicine

## 2019-12-23 ENCOUNTER — Other Ambulatory Visit: Payer: Self-pay | Admitting: Family Medicine

## 2019-12-24 NOTE — Telephone Encounter (Signed)
[place] Database Verified LR: 11-22-2019 Qty: 30 Last office visit: 11-29-2019 Upcoming appointment: No pending appt

## 2019-12-30 ENCOUNTER — Other Ambulatory Visit: Payer: Self-pay | Admitting: Family Medicine

## 2019-12-30 DIAGNOSIS — J301 Allergic rhinitis due to pollen: Secondary | ICD-10-CM

## 2020-01-16 ENCOUNTER — Telehealth: Payer: Self-pay | Admitting: Family Medicine

## 2020-01-16 NOTE — Telephone Encounter (Signed)
Pt called and wants to transfer care from Dr Jonni Sanger to Dr Bryan Lemma because she said [facility] is to far for her and Andrea Beltran would be closer, Is this ok?

## 2020-01-16 NOTE — Telephone Encounter (Signed)
Ok with me 

## 2020-01-17 NOTE — Telephone Encounter (Signed)
Yes, this is ok.  This is her 2nd or 3rd request.  Please transfer her but if she requests returning or another future transfer, we may want to ask her to seek care elsewhere.  She is a great patient and it is fine to transfer due to proximity.

## 2020-01-20 NOTE — Telephone Encounter (Signed)
Pt scheduled  

## 2020-01-26 NOTE — Progress Notes (Signed)
Patient Care Team: Leamon Arnt, MD as PCP - General (Family Medicine) Nicholas Lose, MD as Consulting Physician (Hematology and Oncology)  DIAGNOSIS:    ICD-10-CM   1. Malignant neoplasm of upper-outer quadrant of left breast in female, estrogen receptor positive ([place])  C50.412    Z17.0     SUMMARY OF ONCOLOGIC HISTORY: Oncology History  Malignant neoplasm of upper-outer quadrant of left breast in female, estrogen receptor positive ([place])  03/29/2018 Surgery   Left lumpectomy at [facility] [place]: IDC grade 2, 1.8 x 1.6 x 1 cm, lymphovascular invasion present focally, 0/2 lymph nodes negative, ER positive, PR positive, HER-2 negative IHC 0, T1CN0 stage Ia pathologic stage   04/23/2018 Oncotype testing   Recurrent score: 16, distant recurrence of 9 years: 4%   05/03/2018 - 05/30/2018 Radiation Therapy   Adjuvant radiation therapy in [place] (4256 cGy/16 boost 10 Gy/4)   06/04/2018 -  Anti-estrogen oral therapy   Adjuvant tamoxifen 20 mg daily     CHIEF COMPLIANT: Follow-up of left breast cancer on tamoxifen  INTERVAL HISTORY: Andrea Beltran is a 34 y.o. with above-mentioned history of left breast cancer diagnosed in [place] and treated with lumpectomy, radiation, and is currently on anti-estrogen therapy with tamoxifen. Mammogram on 04/22/19 showed no evidence of malignancy bilaterally. Breast MRI on 04/29/19 showed no evidence of malignancy bilaterally. She presents to the clinic today for follow-up.  ALLERGIES:  is allergic to cat hair extract and pollen extract.  MEDICATIONS:  Current Outpatient Medications  Medication Sig Dispense Refill  . cyclobenzaprine (FLEXERIL) 10 MG tablet Take 1 tablet (10 mg total) by mouth at bedtime as needed for muscle spasms. 30 tablet 0  . diclofenac (VOLTAREN) 75 MG EC tablet TAKE 1 TABLET(75 MG) BY MOUTH TWICE DAILY 30 tablet 0  . eszopiclone (LUNESTA) 2 MG TABS tablet TAKE 1 TABLET(2 MG) BY MOUTH AT BEDTIME AS  NEEDED FOR SLEEP 30 tablet 5  . fluticasone (FLONASE) 50 MCG/ACT nasal spray Place 2 sprays into both nostrils daily. 16 g 6  . tamoxifen (NOLVADEX) 10 MG tablet Take 1 tablet (10 mg total) by mouth daily. 90 tablet 3   No current facility-administered medications for this visit.    PHYSICAL EXAMINATION: ECOG PERFORMANCE STATUS: 1 - Symptomatic but completely ambulatory  Vitals:   01/27/20 0944  BP: 109/74  Pulse: 87  Resp: 18  Temp: 98.5 F (36.9 C)  SpO2: 100%   Filed Weights   01/27/20 0944  Weight: 132 lb 11.2 oz (60.2 kg)    BREAST: No palpable masses or nodules in either right or left breasts. No palpable axillary supraclavicular or infraclavicular adenopathy no breast tenderness or nipple discharge. (exam performed in the presence of a chaperone)  LABORATORY DATA:  I have reviewed the data as listed CMP Latest Ref Rng & Units 09/13/2019 03/26/2019  Glucose 70 - 99 mg/dL 97 96  BUN 6 - 23 mg/dL 12 10  Creatinine 0.40 - 1.20 mg/dL 0.64 0.64  Sodium 135 - 145 mEq/L 139 140  Potassium 3.5 - 5.1 mEq/L 3.8 3.9  Chloride 96 - 112 mEq/L 104 103  CO2 19 - 32 mEq/L 29 29  Calcium 8.4 - 10.5 mg/dL 9.4 9.7  Total Protein 6.0 - 8.3 g/dL 8.1 8.0  Total Bilirubin 0.2 - 1.2 mg/dL 0.4 0.4  Alkaline Phos 39 - 117 U/L 46 49  AST 0 - 37 U/L 23 21  ALT 0 - 35 U/L 14 14  Lab Results  Component Value Date   WBC 4.7 09/13/2019   HGB 13.0 09/13/2019   HCT 38.8 09/13/2019   MCV 90.8 09/13/2019   PLT 178.0 09/13/2019   NEUTROABS 1.7 09/13/2019    ASSESSMENT & PLAN:  Malignant neoplasm of upper-outer quadrant of left breast in female, estrogen receptor positive ([place]) 03/29/2018:Left lumpectomy at [facility] [place]: IDC grade 2, 1.8 x 1.6 x 1 cm, lymphovascular invasion present focally, 0/2 lymph nodes negative, ER positive, PR positive, HER-2 negative IHC 0, T1CN0 stage Ia pathologic stage Oncotype DX score 16: Risk of recurrence 4% Adjuvant radiation  therapy 05/01/2018-05/30/2018  Current treatment: Adjuvant tamoxifen started 06/03/2018 Tamoxifen toxicities: 1.  Hot flashes and night sweats 2.  Fatigue and hair loss 3.  Emotional mood swings 4.  Heaviness in the chest and palpitations  Breast cancer surveillance: 1. Mammogram 04/22/2019: Benign dystrophic calcification in the right breast next mammogram to be done November 2021 2. Breast MRI: No evidence of malignancy.  Sitting up the next MRI in May 2022 3. Breast exam: Benign 01/27/2020  Patient was inquiring about pregnancy.  I discussed with her if she wants to get pregnant she needs to stop tamoxifen.  She will think about it and inform me. Return to clinic in 1 year for follow-up    No orders of the defined types were placed in this encounter.  The patient has a good understanding of the overall plan. she agrees with it. she will call with any problems that may develop before the next visit here.  Total time spent: 20 mins including face to face time and time spent for planning, charting and coordination of care  Nicholas Lose, MD 01/27/2020  I, Cloyde Reams Dorshimer, am acting as scribe for Dr. Nicholas Lose.  I have reviewed the above documentation for accuracy and completeness, and I agree with the above.

## 2020-01-27 ENCOUNTER — Other Ambulatory Visit: Payer: Self-pay

## 2020-01-27 ENCOUNTER — Inpatient Hospital Stay: Payer: 59 | Attending: Hematology and Oncology | Admitting: Hematology and Oncology

## 2020-01-27 DIAGNOSIS — Z170 Estrogen receptor positive status [ER+]: Secondary | ICD-10-CM | POA: Insufficient documentation

## 2020-01-27 DIAGNOSIS — Z79810 Long term (current) use of selective estrogen receptor modulators (SERMs): Secondary | ICD-10-CM | POA: Diagnosis not present

## 2020-01-27 DIAGNOSIS — C50412 Malignant neoplasm of upper-outer quadrant of left female breast: Secondary | ICD-10-CM | POA: Diagnosis not present

## 2020-01-27 DIAGNOSIS — Z923 Personal history of irradiation: Secondary | ICD-10-CM | POA: Diagnosis not present

## 2020-01-27 NOTE — Assessment & Plan Note (Signed)
03/29/2018:Left lumpectomy at [facility] [place]: IDC grade 2, 1.8 x 1.6 x 1 cm, lymphovascular invasion present focally, 0/2 lymph nodes negative, ER positive, PR positive, HER-2 negative IHC 0, T1CN0 stage Ia pathologic stage Oncotype DX score 16: Risk of recurrence 4% Adjuvant radiation therapy 05/01/2018-05/30/2018  Current treatment: Adjuvant tamoxifen started 06/03/2018 Tamoxifen toxicities: 1.  Hot flashes and night sweats 2.  Fatigue and hair loss 3.  Emotional mood swings 4.  Heaviness in the chest  Breast cancer surveillance: 1. Mammogram 04/22/2019: Benign dystrophic calcification in the right breast 2. Breast MRI: No evidence of malignancy.. 3. Breast exam: Benign  Return to clinic in 1 year for follow-up 

## 2020-03-17 ENCOUNTER — Other Ambulatory Visit: Payer: Self-pay

## 2020-03-18 ENCOUNTER — Ambulatory Visit: Payer: 59 | Admitting: Family Medicine

## 2020-03-18 ENCOUNTER — Encounter: Payer: Self-pay | Admitting: Family Medicine

## 2020-03-18 VITALS — BP 110/68 | HR 82 | Temp 97.9°F | Ht 66.0 in | Wt 132.2 lb

## 2020-03-18 DIAGNOSIS — C50412 Malignant neoplasm of upper-outer quadrant of left female breast: Secondary | ICD-10-CM

## 2020-03-18 DIAGNOSIS — G4701 Insomnia due to medical condition: Secondary | ICD-10-CM

## 2020-03-18 DIAGNOSIS — J301 Allergic rhinitis due to pollen: Secondary | ICD-10-CM

## 2020-03-18 DIAGNOSIS — Z170 Estrogen receptor positive status [ER+]: Secondary | ICD-10-CM

## 2020-03-18 DIAGNOSIS — Z1283 Encounter for screening for malignant neoplasm of skin: Secondary | ICD-10-CM

## 2020-03-18 DIAGNOSIS — Z2821 Immunization not carried out because of patient refusal: Secondary | ICD-10-CM

## 2020-03-18 MED ORDER — ESZOPICLONE 2 MG PO TABS: 2.0000 mg | ORAL_TABLET | Freq: Every evening | ORAL | 2 refills | Status: DC | PRN

## 2020-03-18 MED ORDER — FLUTICASONE PROPIONATE 50 MCG/ACT NA SUSP: 2.0000 | Freq: Every day | NASAL | 6 refills | Status: DC

## 2020-03-18 NOTE — Progress Notes (Signed)
Andrea Beltran is a 34 y.o. female  Chief Complaint  Patient presents with  . Establish Care    TOC- med refills on Lunesta & Flonase. no concerns.  not sure if she wants the flu shot today.     HPI: Andrea Beltran is a 34 y.o. female here to establish care with our office. Her previous PCP Dr. Jonni Sanger.  Pt needs refills of her lunesta which she has taken since started on tamoxifen for breast cancer. Med is effective, no side effects. Pt also with seasonal allergies and requests refill of flonase. Pt would like referral to derm for annual skin check d/t h/o breast cancer. Pt declines flu vaccine today.   Past Medical History:  Diagnosis Date  . Breast cancer ([place])   . Family history of brain cancer   . Family history of breast cancer   . Family history of kidney cancer   . Personal history of radiation therapy 2019    Past Surgical History:  Procedure Laterality Date  . BREAST LUMPECTOMY Left 2019    Social History   Socioeconomic History  . Marital status: Married    Spouse name: Not on file  . Number of children: Not on file  . Years of education: Not on file  . Highest education level: Not on file  Occupational History    Employer: [employer]  Tobacco Use  . Smoking status: Never Smoker  . Smokeless tobacco: Never Used  Substance and Sexual Activity  . Alcohol use: Yes  . Drug use: Never  . Sexual activity: Yes    Birth control/protection: Condom  Other Topics Concern  . Not on file  Social History Narrative  . Not on file   Social Determinants of Health   Financial Resource Strain:   . Difficulty of Paying Living Expenses: Not on file  Food Insecurity:   . Worried About Charity fundraiser in the Last Year: Not on file  . Ran Out of Food in the Last Year: Not on file  Transportation Needs:   . Lack of Transportation (Medical): Not on file  . Lack of Transportation (Non-Medical): Not on file  Physical Activity:   . Days of Exercise per Week: Not on  file  . Minutes of Exercise per Session: Not on file  Stress:   . Feeling of Stress : Not on file  Social Connections:   . Frequency of Communication with Friends and Family: Not on file  . Frequency of Social Gatherings with Friends and Family: Not on file  . Attends Religious Services: Not on file  . Active Member of Clubs or Organizations: Not on file  . Attends Archivist Meetings: Not on file  . Marital Status: Not on file  Intimate Partner Violence:   . Fear of Current or Ex-Partner: Not on file  . Emotionally Abused: Not on file  . Physically Abused: Not on file  . Sexually Abused: Not on file    Family History  Problem Relation Age of Onset  . Breast cancer Maternal Grandmother      Immunization History  Administered Date(s) Administered  . Influenza,inj,Quad PF,6+ Mos 03/26/2019  . PFIZER SARS-COV-2 Vaccination 09/06/2019, 10/01/2019  . Tdap 06/26/2019    Outpatient Encounter Medications as of 03/18/2020  Medication Sig  . eszopiclone (LUNESTA) 2 MG TABS tablet Take 1 tablet (2 mg total) by mouth at bedtime as needed for sleep. Take immediately before bedtime  . fluticasone (FLONASE) 50 MCG/ACT nasal spray Place  2 sprays into both nostrils daily.  . tamoxifen (NOLVADEX) 10 MG tablet Take 1 tablet (10 mg total) by mouth daily.  . [DISCONTINUED] eszopiclone (LUNESTA) 2 MG TABS tablet TAKE 1 TABLET(2 MG) BY MOUTH AT BEDTIME AS NEEDED FOR SLEEP  . [DISCONTINUED] fluticasone (FLONASE) 50 MCG/ACT nasal spray Place 2 sprays into both nostrils daily.  . [DISCONTINUED] cyclobenzaprine (FLEXERIL) 10 MG tablet Take 1 tablet (10 mg total) by mouth at bedtime as needed for muscle spasms. (Patient not taking: Reported on 03/18/2020)  . [DISCONTINUED] diclofenac (VOLTAREN) 75 MG EC tablet TAKE 1 TABLET(75 MG) BY MOUTH TWICE DAILY (Patient not taking: Reported on 03/18/2020)   No facility-administered encounter medications on file as of 03/18/2020.     ROS: Pertinent  positives and negatives noted in HPI. Remainder of ROS non-contributory    Allergies  Allergen Reactions  . Cat Hair Extract Other (See Comments)    sneezing sneezing   . Pollen Extract Other (See Comments)    Sneezing     BP 110/68   Pulse 82   Temp 97.9 F (36.6 C) (Temporal)   Ht 5\' 6"  (1.676 m)   Wt 132 lb 3.2 oz (60 kg)   SpO2 97%   BMI 21.34 kg/m   Physical Exam Constitutional:      General: She is not in acute distress.    Appearance: Normal appearance. She is not ill-appearing.  Pulmonary:     Effort: No respiratory distress.  Neurological:     Mental Status: She is alert and oriented to person, place, and time.  Psychiatric:        Mood and Affect: Mood normal.        Behavior: Behavior normal.      A/P:  1. Insomnia due to medical condition - stable, controlled - database reviewed and appropriate Refill: - eszopiclone (LUNESTA) 2 MG TABS tablet; Take 1 tablet (2 mg total) by mouth at bedtime as needed for sleep. Take immediately before bedtime  Dispense: 30 tablet; Refill: 2 - f/u in 3 mo - VV ok  2. Seasonal allergic rhinitis due to pollen - increased symptoms recently, recommended adding zyrtec Refill: - fluticasone (FLONASE) 50 MCG/ACT nasal spray; Place 2 sprays into both nostrils daily.  Dispense: 16 g; Refill: 6  3. Screening for skin cancer - personal h/o breast cancer - Ambulatory referral to Dermatology  4. Malignant neoplasm of upper-outer quadrant of left breast in female, estrogen receptor positive ([place]) - on tamoxifen, follows with oncology Dr. Lindi Adie  5. Influenza vaccination declined by patient  This visit occurred during the SARS-CoV-2 public health emergency.  Safety protocols were in place, including screening questions prior to the visit, additional usage of staff PPE, and extensive cleaning of exam room while observing appropriate contact time as indicated for disinfecting solutions.

## 2020-04-22 ENCOUNTER — Ambulatory Visit
Admission: RE | Admit: 2020-04-22 | Discharge: 2020-04-22 | Disposition: A | Discharge status: Home / Self Care | Payer: 59 | Source: Ambulatory Visit | Attending: Hematology and Oncology | Admitting: Hematology and Oncology

## 2020-04-22 ENCOUNTER — Other Ambulatory Visit: Payer: Self-pay

## 2020-04-22 DIAGNOSIS — Z170 Estrogen receptor positive status [ER+]: Secondary | ICD-10-CM

## 2020-04-22 IMAGING — MG DIGITAL DIAGNOSTIC BILAT W/ TOMO W/ CAD
8 of 11 series · 9 of 27 positions shown · non-contrast
Comparison: Previous exam(s).

CLINICAL DATA: Status post left lumpectomy and radiation therapy
calcifications.

EXAM:
DIGITAL DIAGNOSTIC BILATERAL MAMMOGRAM WITH TOMO AND CAD

[L MLO]
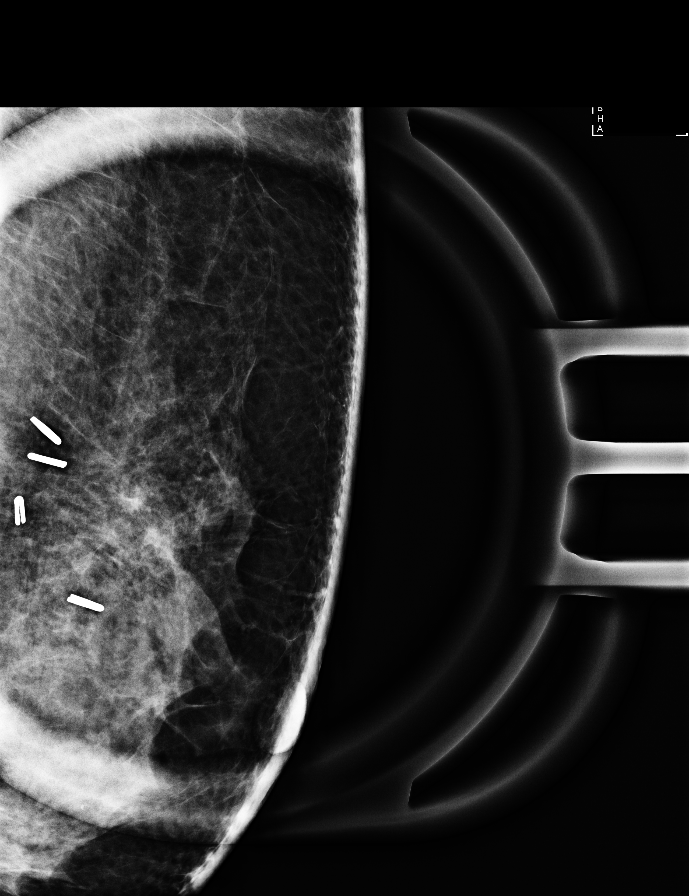

[R CC]
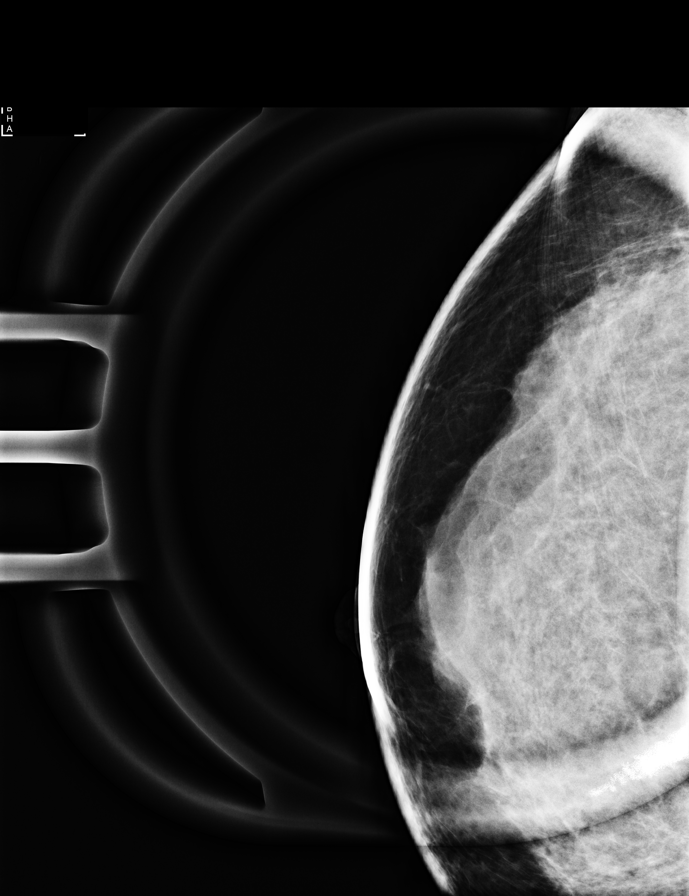

[R ML]
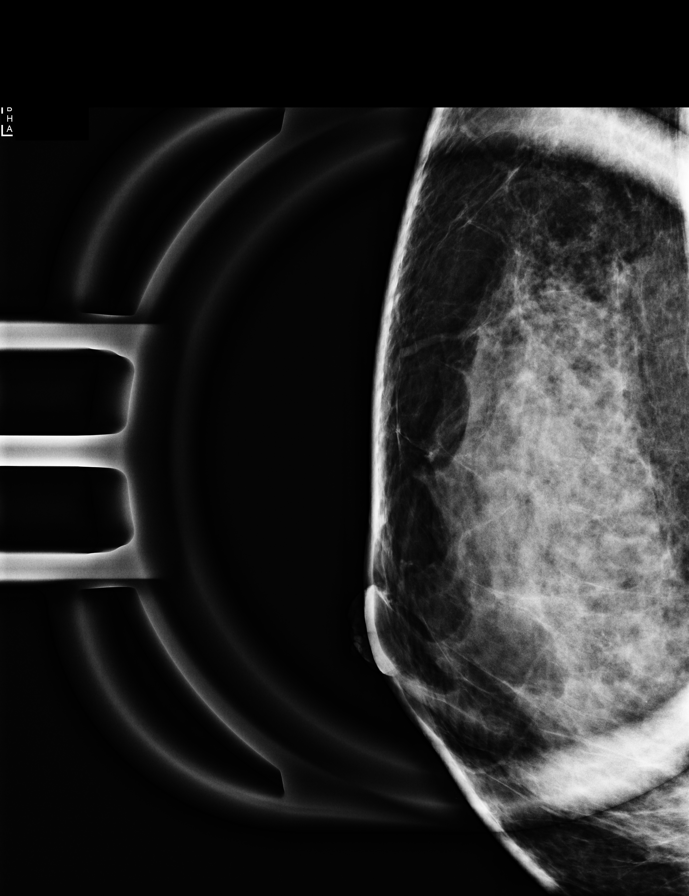

[L MLO synth-2D]
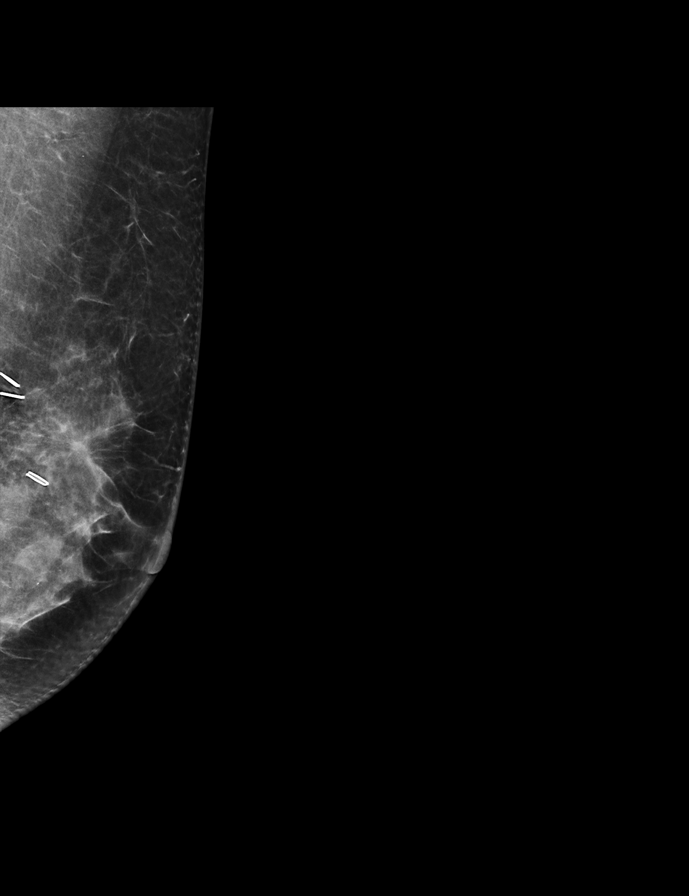

[R CC synth-2D]
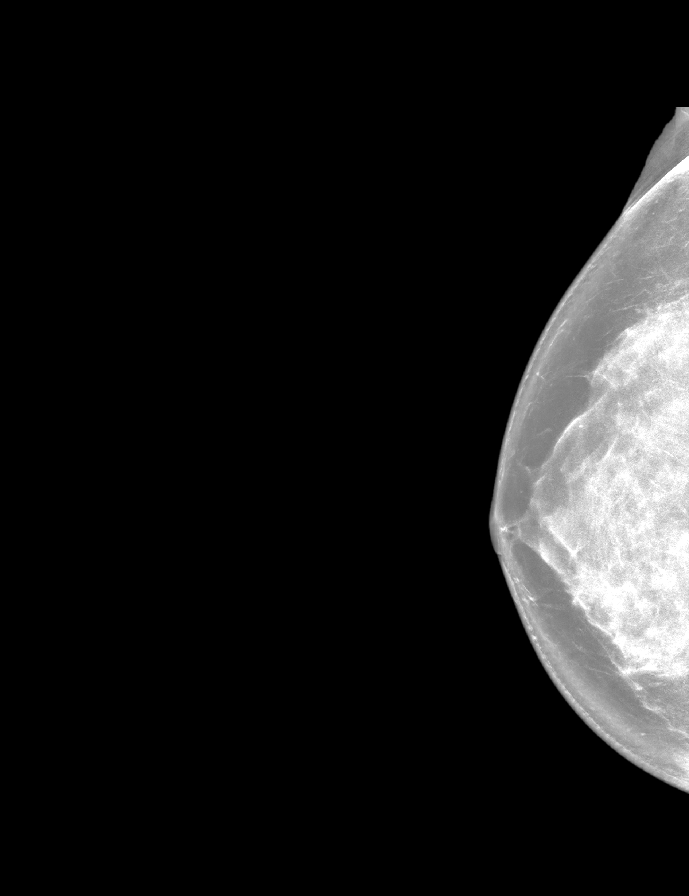

[L CC synth-2D]
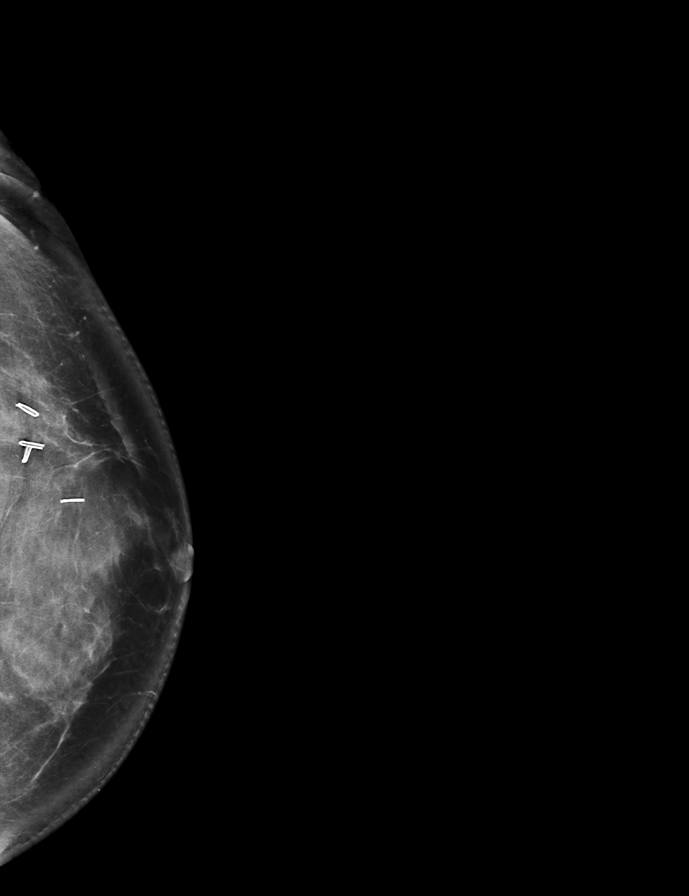

[R MLO synth-2D]
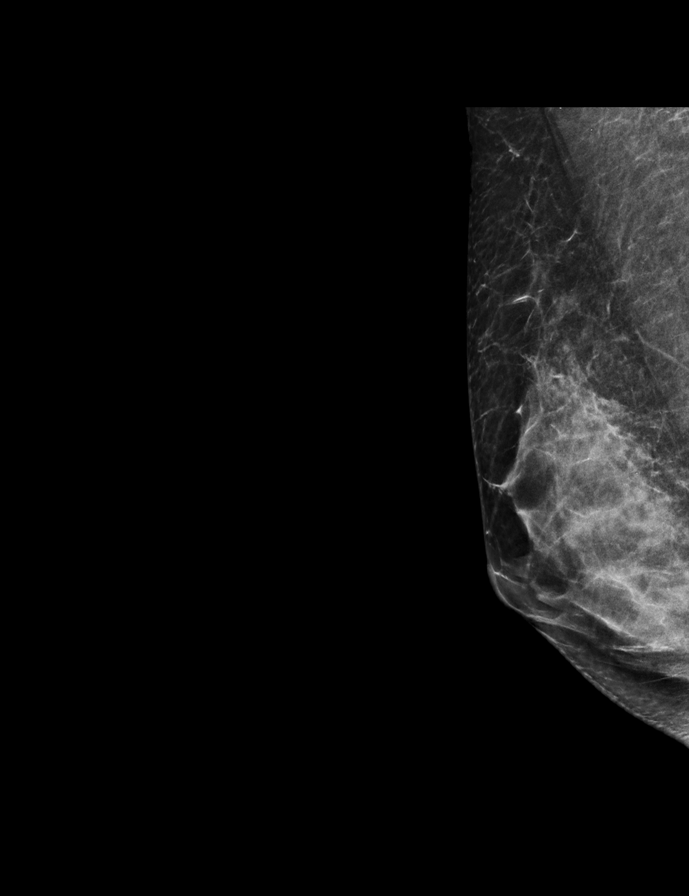

[L MLO tomo · 2 of 65 frames shown]
[frame 21/65]
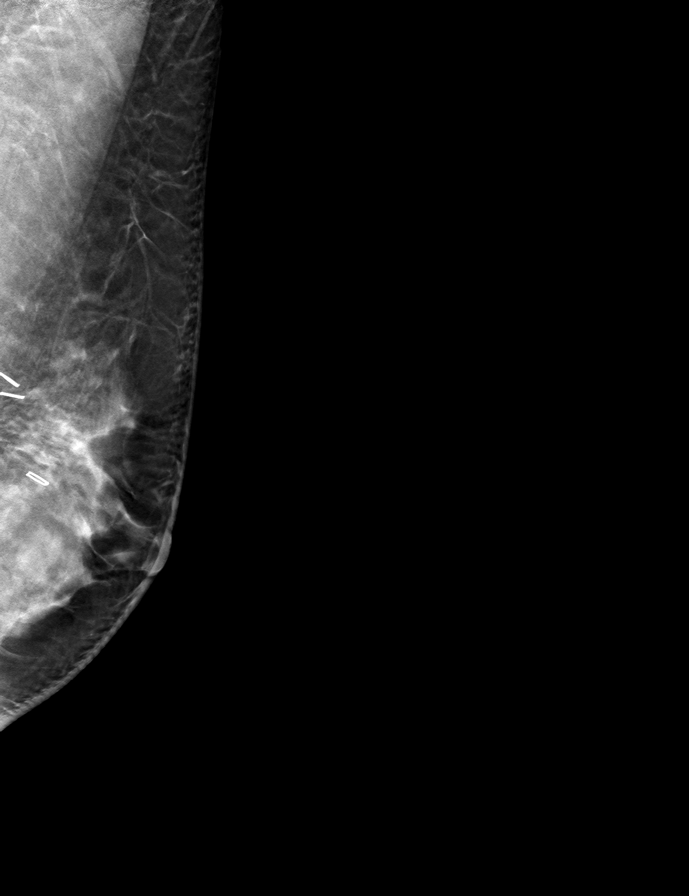
[frame 33/65]
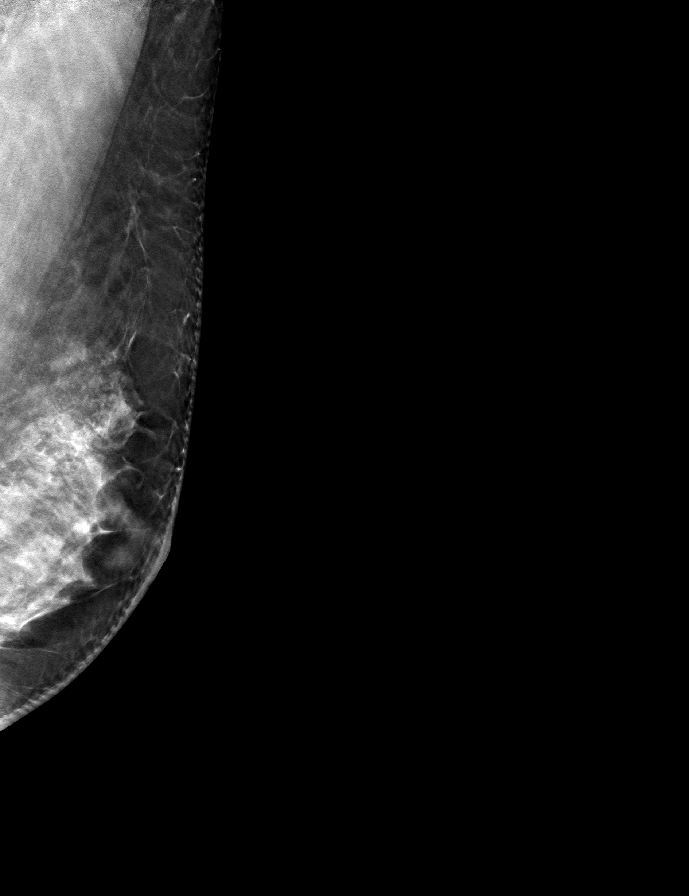

[9 of 27 positions shown; findings below may reference images not displayed]

ACR Breast Density Category d: The breast tissue is extremely dense,
which lowers the sensitivity of mammography.
FINDINGS: Stable post lumpectomy changes on the left. The previously evaluated
probably benign right breast calcifications are no longer present.
No interval findings suspicious for malignancy in either breast.

Mammographic images were processed with CAD.
IMPRESSION: 1. No evidence of malignancy in either breast.
2. Resolved benign right breast calcifications.

RECOMMENDATION:
Bilateral diagnostic mammogram in 1 year.

I have discussed the findings and recommendations with the patient.
If applicable, a reminder letter will be sent to the patient
regarding the next appointment.

BI-RADS CATEGORY  2: Benign.

## 2020-07-06 ENCOUNTER — Other Ambulatory Visit: Payer: Self-pay | Admitting: Family Medicine

## 2020-07-06 DIAGNOSIS — G4701 Insomnia due to medical condition: Secondary | ICD-10-CM

## 2020-07-06 NOTE — Telephone Encounter (Signed)
Pease advise on refill. Thank you.  

## 2020-07-13 ENCOUNTER — Other Ambulatory Visit: Payer: Self-pay | Admitting: Adult Health

## 2020-07-13 DIAGNOSIS — C50412 Malignant neoplasm of upper-outer quadrant of left female breast: Secondary | ICD-10-CM

## 2020-07-13 DIAGNOSIS — Z170 Estrogen receptor positive status [ER+]: Secondary | ICD-10-CM

## 2020-08-14 ENCOUNTER — Ambulatory Visit: Payer: 59 | Admitting: Family Medicine

## 2020-08-19 ENCOUNTER — Ambulatory Visit: Payer: 59 | Admitting: Family Medicine

## 2020-08-19 ENCOUNTER — Other Ambulatory Visit: Payer: Self-pay

## 2020-08-19 ENCOUNTER — Encounter: Payer: Self-pay | Admitting: Family Medicine

## 2020-08-19 VITALS — BP 102/66 | HR 75 | Temp 98.0°F | Ht 66.0 in | Wt 134.6 lb

## 2020-08-19 DIAGNOSIS — G4701 Insomnia due to medical condition: Secondary | ICD-10-CM

## 2020-08-19 DIAGNOSIS — K8050 Calculus of bile duct without cholangitis or cholecystitis without obstruction: Secondary | ICD-10-CM | POA: Diagnosis not present

## 2020-08-19 MED ORDER — ESZOPICLONE 2 MG PO TABS: 2.0000 mg | ORAL_TABLET | Freq: Every day | ORAL | 0 refills | Status: DC

## 2020-08-19 NOTE — Progress Notes (Signed)
Andrea Beltran is a 35 y.o. female  Chief Complaint  Patient presents with  . Acute Visit    C/o stomach pains when eating greasy foods off/on  but getting more frequently.   She has been taking Advil and Tylenol with some relief.   Declines flu shot.     HPI: Andrea Beltran is a 35 y.o. female who complains of Rt upper abdominal pain that occurs after eating foods that are greasy, like pizza, doughnuts. Symptoms x 10 years but now occurring more frequency.  She had RUQ [place] in 03/2019 - WNL. CMP, CBC also done at that time and WNL  Pt is traveling back to [place] for 2 mo to see family and is requesting 90 day supply of lunesta.  Past Medical History:  Diagnosis Date  . Breast cancer ([place])   . Family history of brain cancer   . Family history of breast cancer   . Family history of kidney cancer   . Personal history of radiation therapy 2019    Past Surgical History:  Procedure Laterality Date  . BREAST LUMPECTOMY Left 2019    Social History   Socioeconomic History  . Marital status: Married    Spouse name: Not on file  . Number of children: Not on file  . Years of education: Not on file  . Highest education level: Not on file  Occupational History    Employer: [employer]  Tobacco Use  . Smoking status: Never Smoker  . Smokeless tobacco: Never Used  Substance and Sexual Activity  . Alcohol use: Yes  . Drug use: Never  . Sexual activity: Yes    Birth control/protection: Condom  Other Topics Concern  . Not on file  Social History Narrative  . Not on file   Social Determinants of Health   Financial Resource Strain: Not on file  Food Insecurity: Not on file  Transportation Needs: Not on file  Physical Activity: Not on file  Stress: Not on file  Social Connections: Not on file  Intimate Partner Violence: Not on file    Family History  Problem Relation Age of Onset  . Breast cancer Maternal Grandmother      Immunization History  Administered Date(s)  Administered  . Influenza,inj,Quad PF,6+ Mos 03/26/2019  . PFIZER(Purple Top)SARS-COV-2 Vaccination 09/06/2019, 10/01/2019  . Tdap 06/26/2019    Outpatient Encounter Medications as of 08/19/2020  Medication Sig  . eszopiclone (LUNESTA) 2 MG TABS tablet TAKE 1 TABLET(2 MG) BY MOUTH AT BEDTIME AS NEEDED FOR SLEEP  . fluticasone (FLONASE) 50 MCG/ACT nasal spray Place 2 sprays into both nostrils daily.  Marland Kitchen loratadine (CLARITIN) 10 MG tablet Take 10 mg by mouth daily.  . tamoxifen (NOLVADEX) 10 MG tablet TAKE 1 TABLET(10 MG) BY MOUTH DAILY   No facility-administered encounter medications on file as of 08/19/2020.     ROS: Pertinent positives and negatives noted in HPI. Remainder of ROS non-contributory    Allergies  Allergen Reactions  . Cat Hair Extract Other (See Comments)    sneezing sneezing   . Pollen Extract Other (See Comments)    Sneezing     BP 102/66   Pulse 75   Temp 98 F (36.7 C) (Temporal)   Ht 5\' 6"  (1.676 m)   Wt 134 lb 9.6 oz (61.1 kg)   SpO2 99%   BMI 21.73 kg/m   Wt Readings from Last 3 Encounters:  08/19/20 134 lb 9.6 oz (61.1 kg)  03/18/20 132 lb 3.2 oz (60  kg)  01/27/20 132 lb 11.2 oz (60.2 kg)   Temp Readings from Last 3 Encounters:  08/19/20 98 F (36.7 C) (Temporal)  03/18/20 97.9 F (36.6 C) (Temporal)  01/27/20 98.5 F (36.9 C) (Temporal)   BP Readings from Last 3 Encounters:  08/19/20 102/66  03/18/20 110/68  01/27/20 109/74   Pulse Readings from Last 3 Encounters:  08/19/20 75  03/18/20 82  01/27/20 87     Physical Exam Constitutional:      Appearance: Normal appearance.  Abdominal:     General: Abdomen is flat. Bowel sounds are normal. There is no distension.     Palpations: Abdomen is soft.     Tenderness: There is no abdominal tenderness. There is no guarding or rebound.  Neurological:     Mental Status: She is alert and oriented to person, place, and time.  Psychiatric:        Mood and Affect: Mood normal.         Behavior: Behavior normal.      A/P:  1. Recurrent biliary colic - symptoms x years, but now more frequently  - normal RUQ [place] and CMP in 03/2019 - NM Hepato W/EF; Future  2. Insomnia due to medical condition - pt traveling to [place] for 2 mo to see her parents Refill: - eszopiclone (LUNESTA) 2 MG TABS tablet; Take 1 tablet (2 mg total) by mouth at bedtime. Take immediately before bedtime  Dispense: 90 tablet; Refill: 0   This visit occurred during the SARS-CoV-2 public health emergency.  Safety protocols were in place, including screening questions prior to the visit, additional usage of staff PPE, and extensive cleaning of exam room while observing appropriate contact time as indicated for disinfecting solutions.

## 2020-08-28 ENCOUNTER — Encounter (HOSPITAL_COMMUNITY): Payer: Self-pay

## 2020-08-28 ENCOUNTER — Ambulatory Visit (HOSPITAL_COMMUNITY)
Admission: RE | Admit: 2020-08-28 | Discharge: 2020-08-28 | Disposition: A | Discharge status: Home / Self Care | Payer: 59 | Source: Ambulatory Visit | Attending: Family Medicine | Admitting: Family Medicine

## 2020-08-28 ENCOUNTER — Other Ambulatory Visit: Payer: Self-pay

## 2020-08-28 DIAGNOSIS — K8050 Calculus of bile duct without cholangitis or cholecystitis without obstruction: Secondary | ICD-10-CM | POA: Insufficient documentation

## 2020-09-14 ENCOUNTER — Other Ambulatory Visit: Payer: Self-pay | Admitting: *Deleted

## 2020-09-14 ENCOUNTER — Encounter: Payer: Self-pay | Admitting: Hematology and Oncology

## 2020-09-14 DIAGNOSIS — C50412 Malignant neoplasm of upper-outer quadrant of left female breast: Secondary | ICD-10-CM

## 2020-09-14 MED ORDER — TAMOXIFEN CITRATE 10 MG PO TABS: ORAL_TABLET | ORAL | 1 refills | Status: DC

## 2020-09-18 ENCOUNTER — Other Ambulatory Visit: Payer: Self-pay

## 2020-09-18 ENCOUNTER — Encounter (HOSPITAL_COMMUNITY)
Admission: RE | Admit: 2020-09-18 | Discharge: 2020-09-18 | Disposition: A | Discharge status: Home / Self Care | Payer: 59 | Source: Ambulatory Visit | Attending: Family Medicine | Admitting: Family Medicine

## 2020-09-18 DIAGNOSIS — K8050 Calculus of bile duct without cholangitis or cholecystitis without obstruction: Secondary | ICD-10-CM | POA: Insufficient documentation

## 2020-09-18 IMAGING — NM NM HEPATO W/GB/PHARM/[PERSON_NAME]
3 series · 13 of 13 positions shown · non-contrast
Comparison: Ultrasound [DATE]

CLINICAL DATA: Biliary colic

EXAM:
NUCLEAR MEDICINE HEPATOBILIARY IMAGING WITH GALLBLADDER EF
TECHNIQUE: Sequential images of the abdomen were obtained [DATE] minutes
following intravenous administration of radiopharmaceutical. After
oral ingestion of Ensure, gallbladder ejection fraction was
determined. At 60 min, normal ejection fraction is greater than 33%.
RADIOPHARMACEUTICALS:  5.5 mCi [H0]  Choletec IV

[he hepatobiliary · 4.52mm/px · 6 of 60 frames shown (1 of 3)]
[frame 6/60]
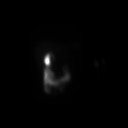
[frame 16/60]
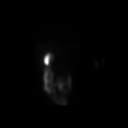
[frame 26/60]
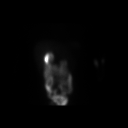
[frame 36/60]
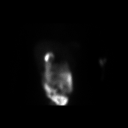
[frame 46/60]
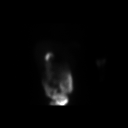
[frame 56/60]
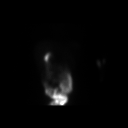

[he hepatobiliary · 2.26mm/px · 1 of 1 slices shown (2 of 3)]
[im 1/1]
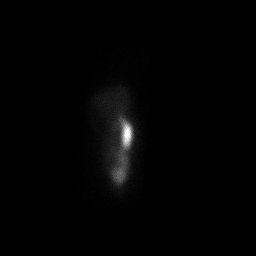

[he hepatobiliary · 4.52mm/px · 6 of 60 frames shown (3 of 3)]
[frame 6/60]
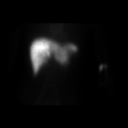
[frame 16/60]
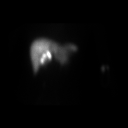
[frame 26/60]
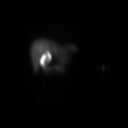
[frame 36/60]
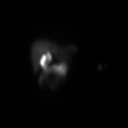
[frame 46/60]
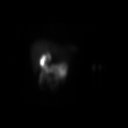
[frame 56/60]
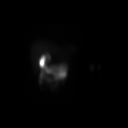

[13 of 13 positions shown; findings below may reference images not displayed]

FINDINGS: Prompt uptake and biliary excretion of activity by the liver is
seen. Gallbladder activity is visualized, consistent with patency of
cystic duct. Biliary activity passes into small bowel, consistent
with patent common bile duct.

Calculated gallbladder ejection fraction is 81%. (Normal gallbladder
ejection fraction with Ensure is greater than 33%.)
IMPRESSION: Negative examination

## 2020-09-18 MED ORDER — TECHNETIUM TC 99M MEBROFENIN IV KIT
5.5000 | PACK | Freq: Once | INTRAVENOUS | Status: AC | PRN
  Administered 2020-09-18: 5.5 via INTRAVENOUS

## 2020-09-23 ENCOUNTER — Encounter: Payer: Self-pay | Admitting: Family Medicine

## 2020-10-08 ENCOUNTER — Other Ambulatory Visit: Payer: Self-pay | Admitting: Hematology and Oncology

## 2020-10-08 DIAGNOSIS — C50412 Malignant neoplasm of upper-outer quadrant of left female breast: Secondary | ICD-10-CM

## 2020-10-08 DIAGNOSIS — Z170 Estrogen receptor positive status [ER+]: Secondary | ICD-10-CM

## 2021-01-25 NOTE — Progress Notes (Signed)
Patient Care Team: Ronnald Nian, DO as PCP - General (Family Medicine) Nicholas Lose, MD as Consulting Physician (Hematology and Oncology)  DIAGNOSIS:    ICD-10-CM   1. Malignant neoplasm of upper-outer quadrant of left breast in female, estrogen receptor positive ([place])  C50.412    Z17.0       SUMMARY OF ONCOLOGIC HISTORY: Oncology History  Malignant neoplasm of upper-outer quadrant of left breast in female, estrogen receptor positive ([place])  03/29/2018 Surgery   Left lumpectomy at [facility] [place]: IDC grade 2, 1.8 x 1.6 x 1 cm, lymphovascular invasion present focally, 0/2 lymph nodes negative, ER positive, PR positive, HER-2 negative IHC 0, T1CN0 stage Ia pathologic stage    04/23/2018 Oncotype testing   Recurrent score: 16, distant recurrence of 9 years: 4%    05/03/2018 - 05/30/2018 Radiation Therapy   Adjuvant radiation therapy in [place] (4256 cGy/16 boost 10 Gy/4)    06/04/2018 -  Anti-estrogen oral therapy   Adjuvant tamoxifen 20 mg daily      CHIEF COMPLIANT: Follow-up of left breast cancer on tamoxifen  INTERVAL HISTORY: Andrea Beltran is a 35 y.o. with above-mentioned history of left breast cancer diagnosed in [place] and treated with lumpectomy, radiation, and is currently on anti-estrogen therapy with tamoxifen. Mammogram on 04/22/20 showed no evidence of malignancy bilaterally. She presents to the clinic today for follow-up.   ALLERGIES:  is allergic to cat hair extract and pollen extract.  MEDICATIONS:  Current Outpatient Medications  Medication Sig Dispense Refill   eszopiclone (LUNESTA) 2 MG TABS tablet Take 1 tablet (2 mg total) by mouth at bedtime. Take immediately before bedtime 90 tablet 0   fluticasone (FLONASE) 50 MCG/ACT nasal spray Place 2 sprays into both nostrils daily. 16 g 6   loratadine (CLARITIN) 10 MG tablet Take 10 mg by mouth daily.     tamoxifen (NOLVADEX) 10 MG tablet TAKE 1 TABLET(10 MG) BY MOUTH DAILY  90 tablet 0   No current facility-administered medications for this visit.    PHYSICAL EXAMINATION: ECOG PERFORMANCE STATUS: 1 - Symptomatic but completely ambulatory  Vitals:   01/26/21 1103  BP: 107/69  Pulse: 69  Resp: 18  Temp: (!) 97 F (36.1 C)  SpO2: 100%   Filed Weights   01/26/21 1103  Weight: 127 lb 14.4 oz (58 kg)    BREAST: No palpable masses or nodules in either right or left breasts. No palpable axillary supraclavicular or infraclavicular adenopathy no breast tenderness or nipple discharge. (exam performed in the presence of a chaperone)  LABORATORY DATA:  I have reviewed the data as listed CMP Latest Ref Rng & Units 09/13/2019 03/26/2019  Glucose 70 - 99 mg/dL 97 96  BUN 6 - 23 mg/dL 12 10  Creatinine 0.40 - 1.20 mg/dL 0.64 0.64  Sodium 135 - 145 mEq/L 139 140  Potassium 3.5 - 5.1 mEq/L 3.8 3.9  Chloride 96 - 112 mEq/L 104 103  CO2 19 - 32 mEq/L 29 29  Calcium 8.4 - 10.5 mg/dL 9.4 9.7  Total Protein 6.0 - 8.3 g/dL 8.1 8.0  Total Bilirubin 0.2 - 1.2 mg/dL 0.4 0.4  Alkaline Phos 39 - 117 U/L 46 49  AST 0 - 37 U/L 23 21  ALT 0 - 35 U/L 14 14    Lab Results  Component Value Date   WBC 4.7 09/13/2019   HGB 13.0 09/13/2019   HCT 38.8 09/13/2019   MCV 90.8 09/13/2019   PLT 178.0 09/13/2019  NEUTROABS 1.7 09/13/2019    ASSESSMENT & PLAN:  Malignant neoplasm of upper-outer quadrant of left breast in female, estrogen receptor positive ([place]) 03/29/2018:Left lumpectomy at [facility] [place]: IDC grade 2, 1.8 x 1.6 x 1 cm, lymphovascular invasion present focally, 0/2 lymph nodes negative, ER positive, PR positive, HER-2 negative IHC 0, T1CN0 stage Ia pathologic stage Oncotype DX score 16: Risk of recurrence 4% Adjuvant radiation therapy 05/01/2018-05/30/2018   Current treatment: Adjuvant tamoxifen started 06/03/2018 Tamoxifen toxicities: 1.  Hot flashes and night sweats 2.  Fatigue and hair loss 3.  Emotional mood  swings  Patient is interested in Zoladex injections.  Her menstrual cycles. We decided to initiate Zoladex once every 3 months starting next week.   Breast cancer surveillance: 1. Mammogram 04/22/2020: Benign  2. Breast MRI: No evidence of malignancy.  I ordered another MRI breast to be done in the next month or so.  Return to clinic in 6 months for follow-up    No orders of the defined types were placed in this encounter.  The patient has a good understanding of the overall plan. she agrees with it. she will call with any problems that may develop before the next visit here.  Total time spent: 20 mins including face to face time and time spent for planning, charting and coordination of care  Rulon Eisenmenger, MD, MPH 01/26/2021  I, Thana Ates, am acting as scribe for Dr. Nicholas Lose.  I have reviewed the above documentation for accuracy and completeness, and I agree with the above.

## 2021-01-25 NOTE — Assessment & Plan Note (Signed)
03/29/2018:Left lumpectomy at [facility] [place]: IDC grade 2, 1.8 x 1.6 x 1 cm, lymphovascular invasion present focally, 0/2 lymph nodes negative, ER positive, PR positive, HER-2 negative IHC 0, T1CN0 stage Ia pathologic stage Oncotype DX score 16: Risk of recurrence 4% Adjuvant radiation therapy 05/01/2018-05/30/2018  Current treatment: Adjuvant tamoxifen started 06/03/2018 Tamoxifen toxicities: 1.Hot flashes and night sweats 2.Fatigue and hair loss 3.Emotional mood swings 4.Heaviness in the chest and palpitations  Breast cancer surveillance: 1.Mammogram 04/22/2020: Benign dystrophic calcification in the right breast next mammogram to be done November 2021 2.Breast MRI: No evidence of malignancy.  Sitting up the next MRI in May 2022 3.Breast exam: Benign 01/26/2021  Patient was inquiring about pregnancy.  I discussed with her if she wants to get pregnant she needs to stop tamoxifen.  She will think about it and inform me. Return to clinic in 1 year for follow-up

## 2021-01-26 ENCOUNTER — Other Ambulatory Visit: Payer: Self-pay

## 2021-01-26 ENCOUNTER — Inpatient Hospital Stay: Payer: 59 | Attending: Hematology and Oncology | Admitting: Hematology and Oncology

## 2021-01-26 DIAGNOSIS — Z170 Estrogen receptor positive status [ER+]: Secondary | ICD-10-CM | POA: Diagnosis not present

## 2021-01-26 DIAGNOSIS — C50412 Malignant neoplasm of upper-outer quadrant of left female breast: Secondary | ICD-10-CM | POA: Diagnosis not present

## 2021-01-26 DIAGNOSIS — Z923 Personal history of irradiation: Secondary | ICD-10-CM | POA: Insufficient documentation

## 2021-01-26 DIAGNOSIS — Z79810 Long term (current) use of selective estrogen receptor modulators (SERMs): Secondary | ICD-10-CM | POA: Diagnosis not present

## 2021-01-26 MED ORDER — TAMOXIFEN CITRATE 10 MG PO TABS: ORAL_TABLET | ORAL | 3 refills | Status: DC

## 2021-01-27 ENCOUNTER — Encounter: Payer: Self-pay | Admitting: Hematology and Oncology

## 2021-02-02 ENCOUNTER — Telehealth: Payer: Self-pay | Admitting: Hematology and Oncology

## 2021-02-02 ENCOUNTER — Encounter: Payer: Self-pay | Admitting: *Deleted

## 2021-02-02 ENCOUNTER — Inpatient Hospital Stay: Payer: 59

## 2021-02-02 ENCOUNTER — Encounter: Payer: Self-pay | Admitting: Hematology and Oncology

## 2021-02-02 NOTE — Progress Notes (Signed)
Enrolled patient in Zoladex copay savings program for assistance referred by RN.  Patient approved to receive Zoladex leaving her with a $0 copay after insurance pays their portion with a maximum benefit of $300 per 30 day fill and $900 per 90 day fill through eligibility period as long as she remains commercially insured. The annual maximum benefit is $2000.  A copy of the approval letter with claim details given to [facility] for billing/claim submissions.  A copy of the letter will be mailed to patient as well for her records only.

## 2021-02-02 NOTE — Telephone Encounter (Signed)
Scheduled appt per 8/16 sch msg. Pt aware.

## 2021-02-04 ENCOUNTER — Other Ambulatory Visit: Payer: Self-pay

## 2021-02-04 ENCOUNTER — Inpatient Hospital Stay: Payer: 59

## 2021-02-04 VITALS — BP 123/77 | HR 81 | Temp 99.0°F | Resp 16

## 2021-02-04 DIAGNOSIS — C50412 Malignant neoplasm of upper-outer quadrant of left female breast: Secondary | ICD-10-CM | POA: Diagnosis not present

## 2021-02-04 DIAGNOSIS — Z170 Estrogen receptor positive status [ER+]: Secondary | ICD-10-CM

## 2021-02-04 MED ORDER — GOSERELIN ACETATE 10.8 MG ~~LOC~~ IMPL
10.8000 mg | DRUG_IMPLANT | Freq: Once | SUBCUTANEOUS | Status: AC
  Administered 2021-02-04: 10.8 mg via SUBCUTANEOUS
  Filled 2021-02-04: qty 10.8

## 2021-02-04 NOTE — Patient Instructions (Signed)
Goserelin injection What is this medication? GOSERELIN (GOE se rel in) is similar to a hormone found in the body. It lowers the amount of sex hormones that the body makes. Men will have lower testosterone levels and women will have lower estrogen levels while taking this medicine. In men, this medicine is used to treat prostate cancer; the injection is either given once per month or once every 12 weeks. A once per month injection (only) is used to treat women with endometriosis, dysfunctional uterine bleeding, or advanced breast cancer. This medicine may be used for other purposes; ask your health care provider or pharmacist if you have questions. COMMON BRAND NAME(S): Zoladex What should I tell my care team before I take this medication? They need to know if you have any of these conditions: bone problems diabetes heart disease history of irregular heartbeat an unusual or allergic reaction to goserelin, other medicines, foods, dyes, or preservatives pregnant or trying to get pregnant breast-feeding How should I use this medication? This medicine is for injection under the skin. It is given by a health care professional in a hospital or clinic setting. Talk to your pediatrician regarding the use of this medicine in children. Special care may be needed. Overdosage: If you think you have taken too much of this medicine contact a poison control center or emergency room at once. NOTE: This medicine is only for you. Do not share this medicine with others. What if I miss a dose? It is important not to miss your dose. Call your doctor or health care professional if you are unable to keep an appointment. What may interact with this medication? Do not take this medicine with any of the following medications: cisapride dronedarone pimozide thioridazine This medicine may also interact with the following medications: other medicines that prolong the QT interval (an abnormal heart rhythm) This list  may not describe all possible interactions. Give your health care provider a list of all the medicines, herbs, non-prescription drugs, or dietary supplements you use. Also tell them if you smoke, drink alcohol, or use illegal drugs. Some items may interact with your medicine. What should I watch for while using this medication? Visit your doctor or health care provider for regular checks on your progress. Your symptoms may appear to get worse during the first weeks of this therapy. Tell your doctor or healthcare provider if your symptoms do not start to get better or if they get worse after this time. Your bones may get weaker if you take this medicine for a long time. If you smoke or frequently drink alcohol you may increase your risk of bone loss. A family history of osteoporosis, chronic use of drugs for seizures (convulsions), or corticosteroids can also increase your risk of bone loss. Talk to your doctor about how to keep your bones strong. This medicine should stop regular monthly menstruation in women. Tell your doctor if you continue to menstruate. Women should not become pregnant while taking this medicine or for 12 weeks after stopping this medicine. Women should inform their doctor if they wish to become pregnant or think they might be pregnant. There is a potential for serious side effects to an unborn child. Talk to your health care professional or pharmacist for more information. Do not breast-feed an infant while taking this medicine. Men should inform their doctors if they wish to father a child. This medicine may lower sperm counts. Talk to your health care professional or pharmacist for more information. This medicine may   increase blood sugar. Ask your healthcare provider if changes in diet or medicines are needed if you have diabetes. What side effects may I notice from receiving this medication? Side effects that you should report to your doctor or health care professional as soon as  possible: allergic reactions like skin rash, itching or hives, swelling of the face, lips, or tongue bone pain breathing problems changes in vision chest pain feeling faint or lightheaded, falls fever, chills pain, swelling, warmth in the leg pain, tingling, numbness in the hands or feet signs and symptoms of high blood sugar such as being more thirsty or hungry or having to urinate more than normal. You may also feel very tired or have blurry vision signs and symptoms of low blood pressure like dizziness; feeling faint or lightheaded, falls; unusually weak or tired stomach pain swelling of the ankles, feet, hands trouble passing urine or change in the amount of urine unusually high or low blood pressure unusually weak or tired Side effects that usually do not require medical attention (report to your doctor or health care professional if they continue or are bothersome): change in sex drive or performance changes in breast size in both males and females changes in emotions or moods headache hot flashes irritation at site where injected loss of appetite skin problems like acne, dry skin vaginal dryness This list may not describe all possible side effects. Call your doctor for medical advice about side effects. You may report side effects to FDA at 1-800-FDA-1088. Where should I keep my medication? This drug is given in a hospital or clinic and will not be stored at home. NOTE: This sheet is a summary. It may not cover all possible information. If you have questions about this medicine, talk to your doctor, pharmacist, or health care provider.  2022 Elsevier/Gold Standard (2018-09-24 14:05:56)  

## 2021-02-10 ENCOUNTER — Encounter: Payer: Self-pay | Admitting: Family Medicine

## 2021-02-10 ENCOUNTER — Other Ambulatory Visit: Payer: Self-pay

## 2021-02-10 ENCOUNTER — Ambulatory Visit: Payer: 59 | Admitting: Family Medicine

## 2021-02-10 VITALS — BP 96/68 | HR 90 | Temp 97.8°F | Ht 66.0 in | Wt 127.2 lb

## 2021-02-10 DIAGNOSIS — Z170 Estrogen receptor positive status [ER+]: Secondary | ICD-10-CM | POA: Diagnosis not present

## 2021-02-10 DIAGNOSIS — R232 Flushing: Secondary | ICD-10-CM | POA: Diagnosis not present

## 2021-02-10 DIAGNOSIS — G4701 Insomnia due to medical condition: Secondary | ICD-10-CM

## 2021-02-10 DIAGNOSIS — C50412 Malignant neoplasm of upper-outer quadrant of left female breast: Secondary | ICD-10-CM | POA: Diagnosis not present

## 2021-02-10 DIAGNOSIS — T451X5A Adverse effect of antineoplastic and immunosuppressive drugs, initial encounter: Secondary | ICD-10-CM

## 2021-02-10 MED ORDER — ESZOPICLONE 2 MG PO TABS: 2.0000 mg | ORAL_TABLET | Freq: Every day | ORAL | 0 refills | Status: DC

## 2021-02-10 NOTE — Progress Notes (Signed)
[facility] [address] [place] [address] Dept: [phone] Dept Fax: [phone]  Transfer of Care Office Visit  Subjective:    Patient ID: Andrea Beltran, female    DOB: 1985/11/26, 35 y.o..   MRN: [id number]  Chief Complaint  Patient presents with   Transitions Of Care    Med recheck/refill    History of Present Illness:  Patient is in today to establish care. ms. Whaling was born in [place], while her father was attending [employer]. Her parents were from [place] and moved back and forth repeatedly over her childhood. She attended college at [employer], [place] in Pharmacologist. She and her husband moved to [place] in 2018. 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 Ms. Gallop has been married ~ 6 years. She has no children. She is taking online classes in design with [employer].  Ms. Markgraf does not use tobacco ro drugs and only occasionally uses alcohol.  Ms. Kindler was diagnosed with left breast cancer in 2019, in [place]. She underwent lumpectomy followed by radiation. Her cancer is ER+, PR+, and HER2-. She is on tamoxifen. She has had a significant hot flashes associated with this. Recently she started Zoladex (goserelin) to try and suppress these symptoms. She notes she is not tolerating this well and plans to discuss this further with Dr. Lindi Adie, her oncologist.  Ms. Donlan has a history of allergic rhinitis, with allergies to pollen and cat dander. She does own a cat and is unwilling to part with the pet. She uses periodic Claritin and Flonase spray to control symptoms.  Ms. Pajak has a history of migraine headaches. She determined that these were triggered by chocolate (esp. dark) and dairy. With avoidance, she has fewer headaches. When these do occur she uses OTC medications.  Ms. Shurtz has a history of insomnia secondary to her tamoxifen use. She is currently managed on Lunesta.  She finds, when sleeping away from home, she can have increased insomnia nd need to take an additional dose. She is engaged in therapy as well.  Past Medical History: Patient Active Problem List   Diagnosis Date Noted   Hot flashes due to tamoxifen 11/09/2018   Malignant neoplasm of upper-outer quadrant of left breast in female, estrogen receptor positive ([place]) 07/30/2018   Seasonal allergic rhinitis 02/10/2017   Insomnia 02/10/2017   Vitamin D deficiency 02/10/2017   Migraine without status migrainosus, not intractable 02/10/2017   Past Surgical History:  Procedure Laterality Date   BREAST LUMPECTOMY Left 2019   Family History  Problem Relation Age of Onset   Hypertension Mother    Stroke Father    Cancer Paternal Uncle        Brain   Cancer Maternal Grandmother        Breast, skin, kidney   Diabetes Paternal Grandfather     Outpatient Medications Prior to Visit  Medication Sig Dispense Refill   fluticasone (FLONASE) 50 MCG/ACT nasal spray Place 2 sprays into both nostrils daily. 16 g 6   goserelin (ZOLADEX) 3.6 MG injection Inject 3.6 mg into the skin every 28 (twenty-eight) days.     loratadine (CLARITIN) 10 MG tablet Take 10 mg by mouth daily.     tamoxifen (NOLVADEX) 10 MG tablet Once daily 90 tablet 3   eszopiclone (LUNESTA) 2 MG TABS tablet Take 1 tablet (2 mg total) by mouth at bedtime. Take immediately before bedtime 90 tablet 0  No facility-administered medications prior to visit.   Allergies  Allergen Reactions   Cat Hair Extract Other (See Comments)    sneezing sneezing    Pollen Extract Other (See Comments)    Sneezing    Objective:   Today's Vitals   02/10/21 1100  BP: 96/68  Pulse: 90  Temp: 97.8 F (36.6 C)  TempSrc: Temporal  SpO2: 97%  Weight: 127 lb 3.2 oz (57.7 kg)  Height: '5\' 6"'  (1.676 m)   Body mass index is 20.53 kg/m.   General: Well developed, well nourished. No acute distress. Psych: Alert and oriented. Normal mood and  affect.  Health Maintenance Due  Topic Date Due   Pneumococcal Vaccine 32-24 Years old (1 - PCV) Never done   Hepatitis C Screening  Never done   COVID-19 Vaccine (3 - Pfizer risk series) 10/29/2019   INFLUENZA VACCINE  01/18/2021     Assessment & Plan:   1. Insomnia due to medical condition We discussed current options for management of her sleep disorder. Since hers is secondary to her current tamoxifen use, I think it is appropriate to continue use of Lunesta for now. I reassured her that it is common to have increased difficulty with sleep when in a strange bed.  - eszopiclone (LUNESTA) 2 MG TABS tablet; Take 1 tablet (2 mg total) by mouth at bedtime. Take immediately before bedtime  Dispense: 90 tablet; Refill: 0  2. Malignant neoplasm of upper-outer quadrant of left breast in female, estrogen receptor positive ([place]) Continue to follow with Dr. Lindi Adie.  3. Hot flashes due to tamoxifen Ms. Gosnell will continue on tamoxifen. She will speak with her oncologist regarding whether she will continue her Zoladex.  Haydee Salter, MD

## 2021-02-26 ENCOUNTER — Other Ambulatory Visit: Payer: Self-pay | Admitting: Hematology and Oncology

## 2021-02-26 ENCOUNTER — Encounter: Payer: Self-pay | Admitting: Hematology and Oncology

## 2021-02-26 DIAGNOSIS — Z170 Estrogen receptor positive status [ER+]: Secondary | ICD-10-CM

## 2021-02-26 DIAGNOSIS — C50412 Malignant neoplasm of upper-outer quadrant of left female breast: Secondary | ICD-10-CM

## 2021-03-17 ENCOUNTER — Encounter: Payer: Self-pay | Admitting: Hematology and Oncology

## 2021-03-17 ENCOUNTER — Other Ambulatory Visit: Payer: Self-pay | Admitting: *Deleted

## 2021-03-17 DIAGNOSIS — C50412 Malignant neoplasm of upper-outer quadrant of left female breast: Secondary | ICD-10-CM

## 2021-03-17 NOTE — Progress Notes (Signed)
Per MD request RN successfully placed orders for breast MRI.

## 2021-03-22 ENCOUNTER — Other Ambulatory Visit: Payer: Self-pay | Admitting: *Deleted

## 2021-03-22 DIAGNOSIS — C50412 Malignant neoplasm of upper-outer quadrant of left female breast: Secondary | ICD-10-CM

## 2021-03-22 DIAGNOSIS — Z170 Estrogen receptor positive status [ER+]: Secondary | ICD-10-CM

## 2021-03-22 NOTE — Progress Notes (Signed)
Per MD due to pt insurance, pt needing to undergo mammogram within the next month and breast MRI in 6 months.  Orders placed, pt notified and verbalized understanding.

## 2021-04-27 ENCOUNTER — Encounter: Payer: Self-pay | Admitting: Hematology and Oncology

## 2021-04-27 ENCOUNTER — Other Ambulatory Visit: Payer: Self-pay

## 2021-04-27 ENCOUNTER — Ambulatory Visit
Admission: RE | Admit: 2021-04-27 | Discharge: 2021-04-27 | Disposition: A | Discharge status: Home / Self Care | Payer: 59 | Source: Ambulatory Visit | Attending: Hematology and Oncology | Admitting: Hematology and Oncology

## 2021-04-27 DIAGNOSIS — Z170 Estrogen receptor positive status [ER+]: Secondary | ICD-10-CM

## 2021-04-27 DIAGNOSIS — C50412 Malignant neoplasm of upper-outer quadrant of left female breast: Secondary | ICD-10-CM

## 2021-04-27 IMAGING — MG DIGITAL DIAGNOSTIC BILAT W/ TOMO W/ CAD
9 series · 9 of 25 positions shown · non-contrast
Comparison: Previous exam(s).

CLINICAL DATA: Patient with history of left breast lumpectomy [KX].

EXAM:
DIGITAL DIAGNOSTIC BILATERAL MAMMOGRAM WITH TOMOSYNTHESIS AND CAD
TECHNIQUE: Bilateral digital diagnostic mammography and breast tomosynthesis
was performed. The images were evaluated with computer-aided
detection.

[L MLO]
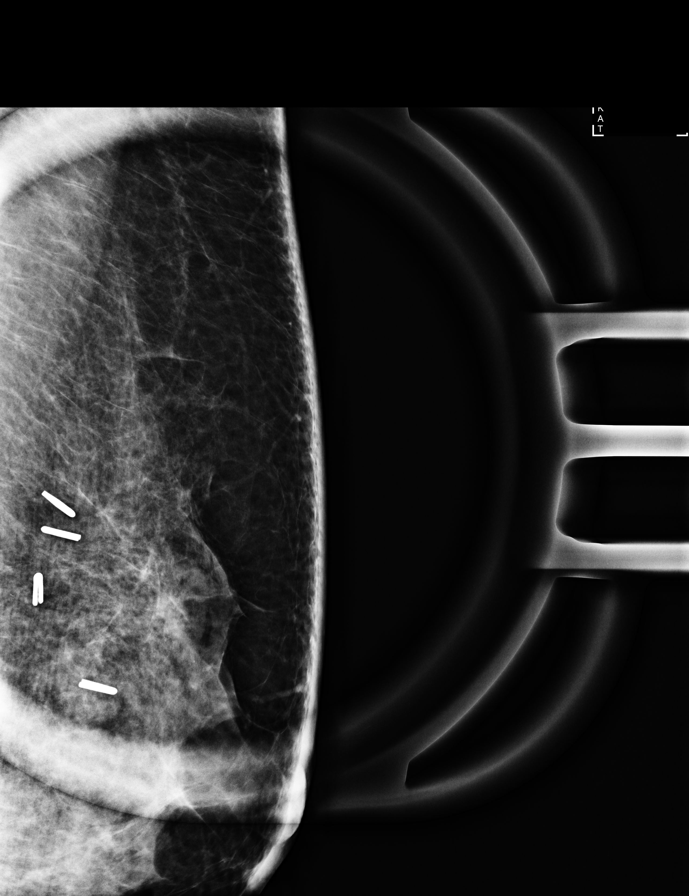

[L CC synth-2D]
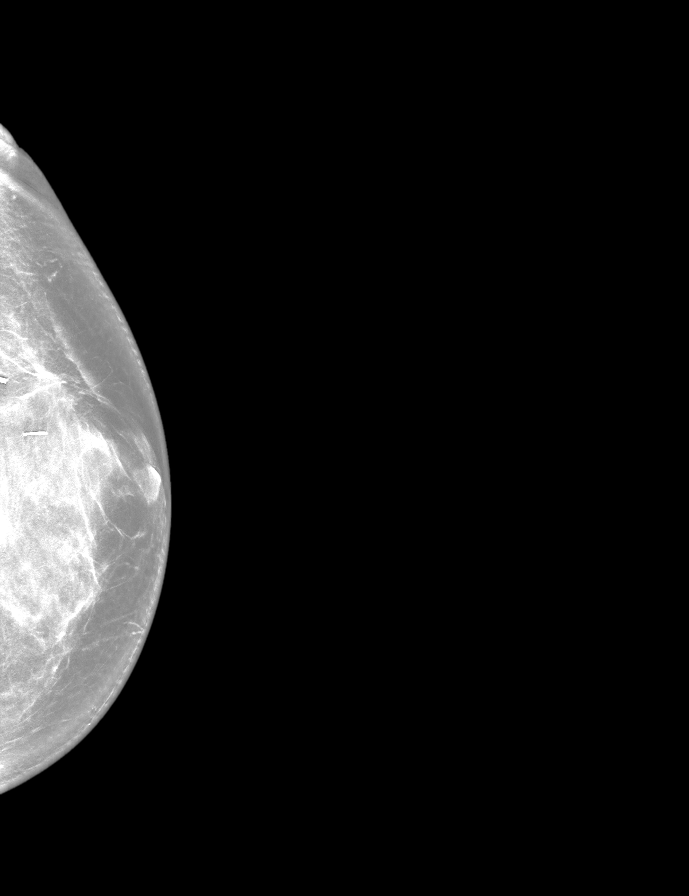

[L MLO synth-2D]
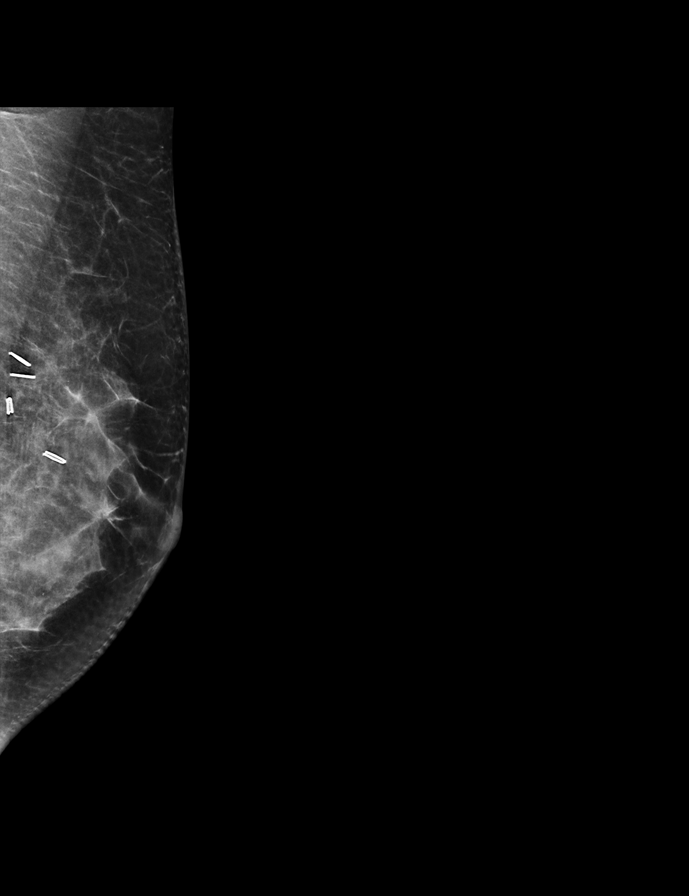

[R MLO synth-2D]
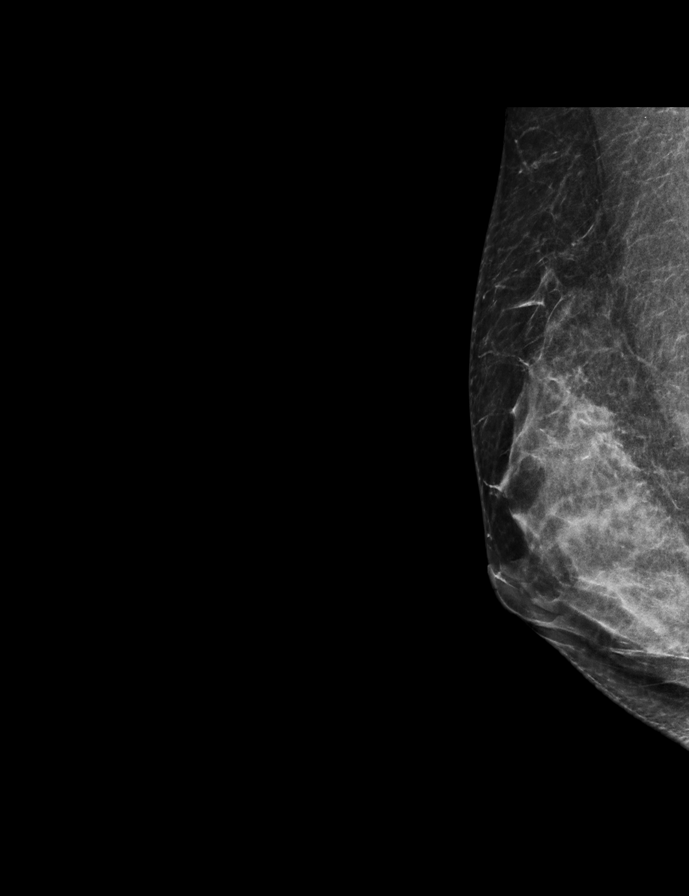

[R CC synth-2D]
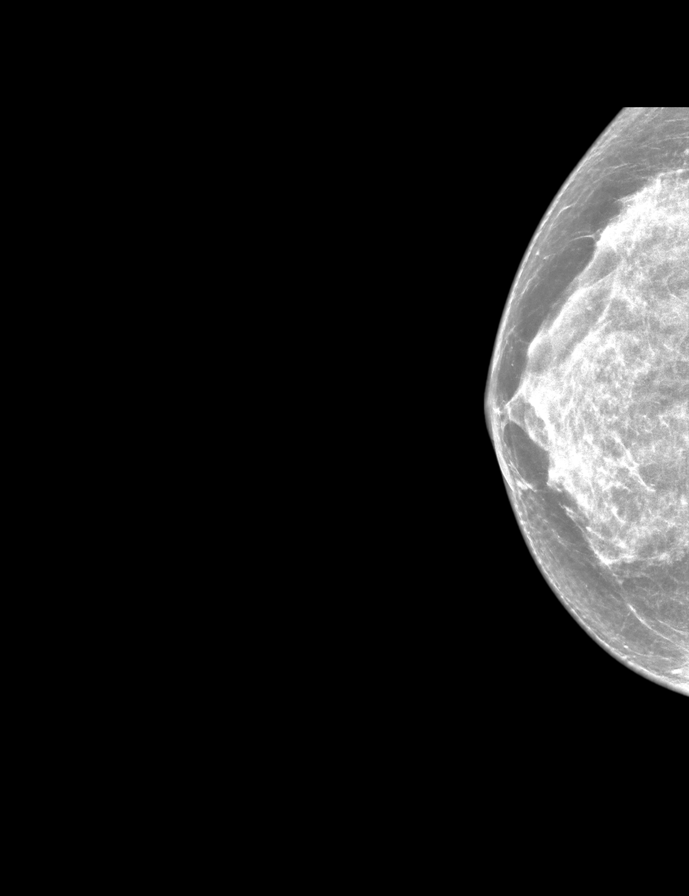

[L CC tomo · tomo slice 35/70.0]
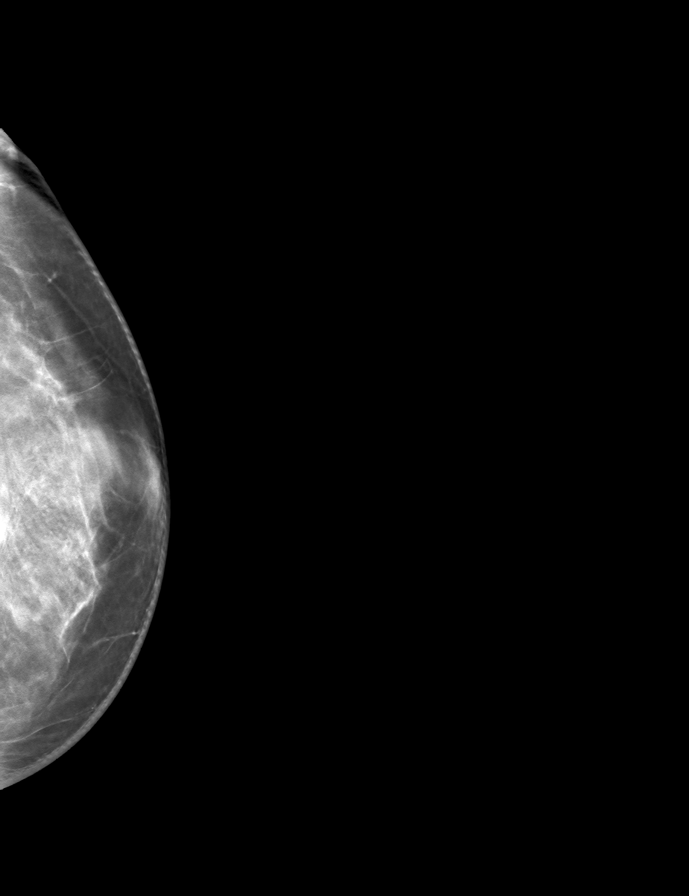

[R MLO tomo · tomo slice 31/60.0]
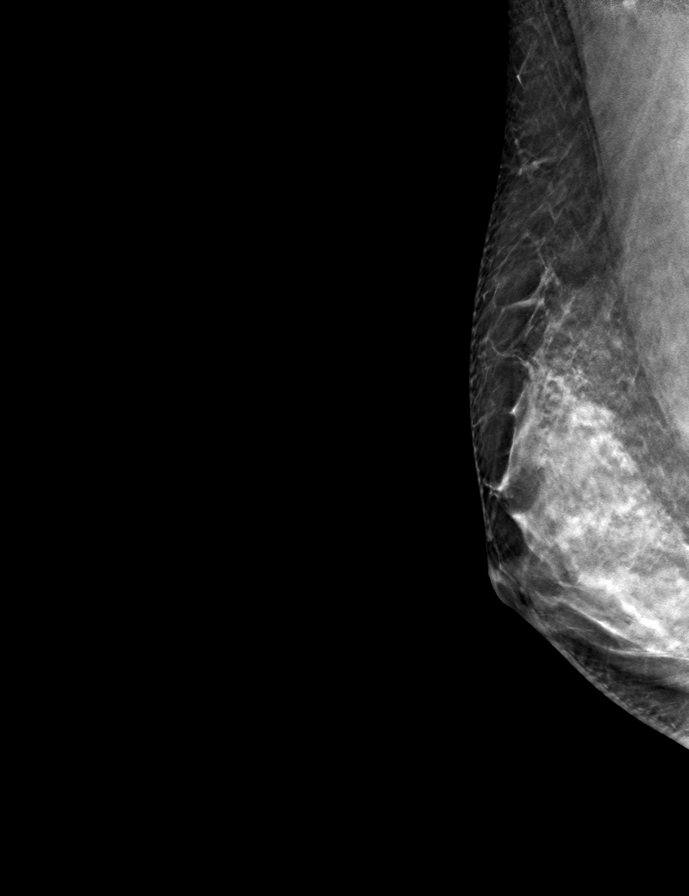

[L MLO tomo · tomo slice 33/64.0]
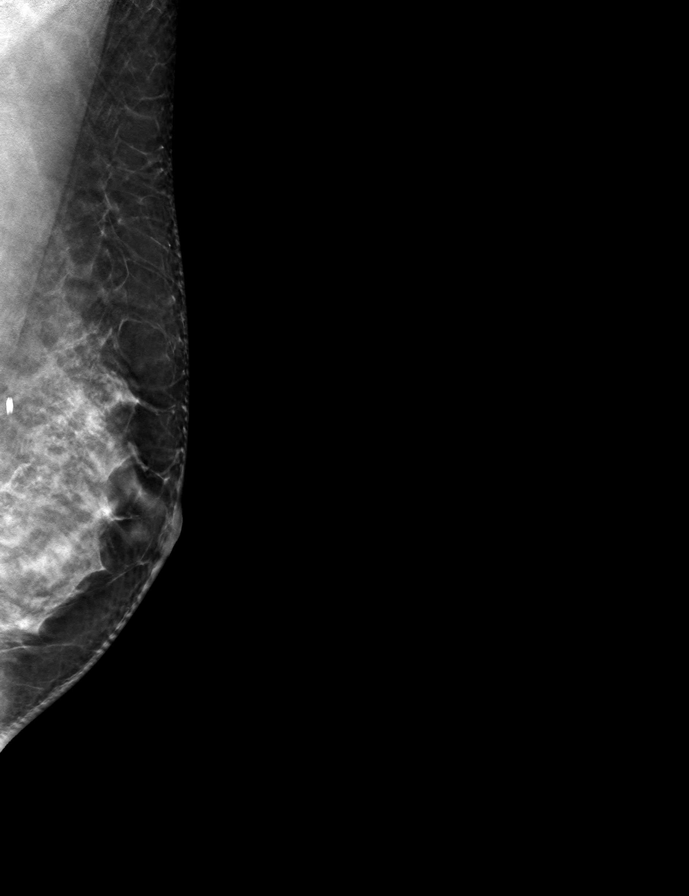

[R CC tomo · tomo slice 31/61.0]
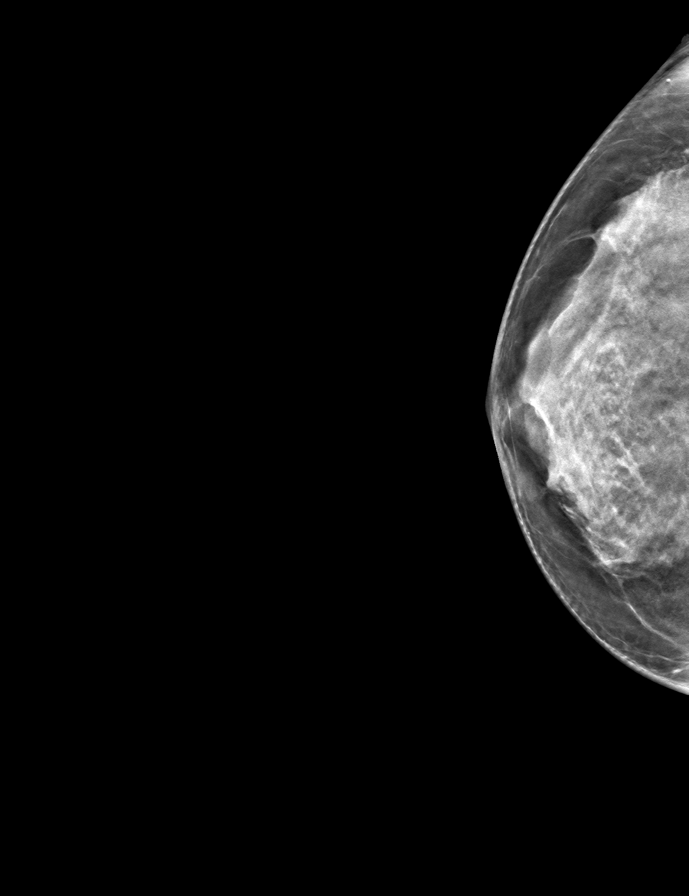

[9 of 25 positions shown; findings below may reference images not displayed]

ACR Breast Density Category d: The breast tissue is extremely dense,
which lowers the sensitivity of mammography.
FINDINGS: Stable postlumpectomy changes left breast. No new masses,
calcifications or distortion identified within either breast.
IMPRESSION: No mammographic evidence for malignancy.

Stable lumpectomy changes left breast.

RECOMMENDATION:
Per protocol, as the patient is now 2 or more years status post
lumpectomy, she may return to annual screening mammography in 1
year. However, given the history of breast cancer, the patient
remains eligible for annual diagnostic mammography if preferred.

I have discussed the findings and recommendations with the patient.
If applicable, a reminder letter will be sent to the patient
regarding the next appointment.

BI-RADS CATEGORY  2: Benign.

## 2021-05-05 ENCOUNTER — Ambulatory Visit: Payer: 59

## 2021-05-05 ENCOUNTER — Encounter: Payer: Self-pay | Admitting: Hematology and Oncology

## 2021-05-15 ENCOUNTER — Other Ambulatory Visit: Payer: Self-pay | Admitting: Family Medicine

## 2021-05-15 DIAGNOSIS — G4701 Insomnia due to medical condition: Secondary | ICD-10-CM

## 2021-06-15 ENCOUNTER — Other Ambulatory Visit: Payer: Self-pay

## 2021-06-24 ENCOUNTER — Ambulatory Visit (INDEPENDENT_AMBULATORY_CARE_PROVIDER_SITE_OTHER): Payer: 59 | Admitting: Family Medicine

## 2021-06-24 ENCOUNTER — Encounter: Payer: Self-pay | Admitting: Hematology and Oncology

## 2021-06-24 ENCOUNTER — Other Ambulatory Visit: Payer: Self-pay

## 2021-06-24 DIAGNOSIS — J3089 Other allergic rhinitis: Secondary | ICD-10-CM | POA: Diagnosis not present

## 2021-06-24 MED ORDER — MONTELUKAST SODIUM 10 MG PO TABS: 10.0000 mg | ORAL_TABLET | Freq: Every day | ORAL | 3 refills | Status: DC

## 2021-06-24 MED ORDER — PREDNISONE 20 MG PO TABS: 20.0000 mg | ORAL_TABLET | Freq: Every day | ORAL | 0 refills | Status: DC

## 2021-06-24 MED ORDER — FLUTICASONE PROPIONATE 50 MCG/ACT NA SUSP: 2.0000 | Freq: Every day | NASAL | 6 refills | Status: DC

## 2021-06-24 NOTE — Progress Notes (Signed)
[facility] [address] [place] [address] Dept: [phone] Dept Fax: [phone]  Office Visit  Subjective:    Patient ID: Andrea Beltran, female    DOB: 09-26-1985, 36 y.o..   MRN: [id number]  Chief Complaint  Patient presents with   Acute Visit    C/o having runny nose, sneezing, itchy eyes x months.  Has taken Claritin, Allegra, Zyrtec, and nasal spray with little relief.      History of Present Illness:  Patient is in today for evaluation of her allergies. she has noted that since Winter started, she has had a flare of nasal congestion , rhinorrhea, watery eye, sore throat, and cough. She feels her tamoxifen may be increasing her symptoms at times. she also finds they worsen when the temperatures are fluctuating between cold and hot. She has been taking Zyrtec daily. However, she ran out of her Flonase. She feels her allergies are in part to cat dander. Her cat does not sleep with her, but does sleep on her bed during the day.  Past Medical History: Patient Active Problem List   Diagnosis Date Noted   Hot flashes due to tamoxifen 11/09/2018   Malignant neoplasm of upper-outer quadrant of left breast in female, estrogen receptor positive ([place]) 07/30/2018   Non-seasonal allergic rhinitis 02/10/2017   Insomnia 02/10/2017   Vitamin D deficiency 02/10/2017   Migraine without status migrainosus, not intractable 02/10/2017   Past Surgical History:  Procedure Laterality Date   BREAST LUMPECTOMY Left 2019   Family History  Problem Relation Age of Onset   Hypertension Mother    Stroke Father    Cancer Paternal Uncle        Brain   Breast cancer Maternal Grandmother    Cancer Maternal Grandmother        Breast, skin, kidney   Diabetes Paternal Grandfather    Outpatient Medications Prior to Visit  Medication Sig Dispense Refill   D3-50 1.25 MG (50000 UT) capsule Take 50,000 Units by mouth once a week.      eszopiclone (LUNESTA) 2 MG TABS tablet TAKE 1 TABLET(2 MG) BY MOUTH AT BEDTIME 90 tablet 1   loratadine (CLARITIN) 10 MG tablet Take 10 mg by mouth daily.     tamoxifen (NOLVADEX) 10 MG tablet Once daily 90 tablet 3   fluticasone (FLONASE) 50 MCG/ACT nasal spray Place 2 sprays into both nostrils daily. 16 g 6   goserelin (ZOLADEX) 3.6 MG injection Inject 3.6 mg into the skin every 28 (twenty-eight) days.     No facility-administered medications prior to visit.   Allergies  Allergen Reactions   Cat Hair Extract Other (See Comments)    sneezing sneezing    Pollen Extract Other (See Comments)    Sneezing     Objective:   Today's Vitals   06/24/21 1036  BP: 116/70  Pulse: 88  Temp: (!) 97 F (36.1 C)  TempSrc: Temporal  SpO2: 98%  Weight: 131 lb (59.4 kg)  Height: 5\' 6"  (1.676 m)   Body mass index is 21.14 kg/m.   General: Well developed, well nourished. No acute distress. HEENT: Normocephalic, non-traumatic. External ears normal. EAC and TMs normal bilaterally. Nose   moderately to severely congested with moderate clear rhinorrhea.   Mucous membranes moist. Moderate mucous streaking of the posterior oropharynx clear. Good dentition. Neck: Supple. No lymphadenopathy. No thyromegaly. Lungs: Clear to auscultation bilaterally. No wheezing, rales or rhonchi. Psych: Alert and oriented. Normal mood and affect.  Health Maintenance Due  Topic Date Due   Pneumococcal Vaccine 72-13 Years old (1 - PCV) Never done   Hepatitis C Screening  Never done   COVID-19 Vaccine (4 - Booster for Pfizer series) 10/29/2020   INFLUENZA VACCINE  01/18/2021     Assessment & Plan:   1. Non-seasonal allergic rhinitis, unspecified trigger With the increase in symptoms in winter, I would suspect allergy to dust mites, pet dander, or cockroaches. We discussed general approaches to reducing allergen exposure, particularly in the bedroom (avoid curtains and carpets, consider a room air filter, purchase  new pillows q 6 months). I recommend she continue a daily antihistamine. I will renew her Flonase. I will add Singulair. I will prescribe a 5-day course of prednisone to try and jump start getting her allergies under control. if not improving in 6 weeks, we would consider referral to an allergist.  - predniSONE (DELTASONE) 20 MG tablet; Take 1 tablet (20 mg total) by mouth daily with breakfast.  Dispense: 5 tablet; Refill: 0 - montelukast (SINGULAIR) 10 MG tablet; Take 1 tablet (10 mg total) by mouth at bedtime.  Dispense: 30 tablet; Refill: 3 - fluticasone (FLONASE) 50 MCG/ACT nasal spray; Place 2 sprays into both nostrils daily.  Dispense: 16 g; Refill: 6  Haydee Salter, MD

## 2021-07-30 ENCOUNTER — Encounter: Payer: Self-pay | Admitting: Hematology and Oncology

## 2021-08-02 ENCOUNTER — Encounter: Payer: Self-pay | Admitting: Hematology and Oncology

## 2021-08-04 NOTE — Progress Notes (Signed)
Patient Care Team: Haydee Salter, MD as PCP - General (Family Medicine) Nicholas Lose, MD as Consulting Physician (Hematology and Oncology)  DIAGNOSIS:    ICD-10-CM   1. Malignant neoplasm of upper-outer quadrant of left breast in female, estrogen receptor positive ([place])  C50.412 MR BREAST BILATERAL W WO CONTRAST INC CAD   Z17.0       SUMMARY OF ONCOLOGIC HISTORY: Oncology History  Malignant neoplasm of upper-outer quadrant of left breast in female, estrogen receptor positive ([place])  03/29/2018 Surgery   Left lumpectomy at [facility] [place]: IDC grade 2, 1.8 x 1.6 x 1 cm, lymphovascular invasion present focally, 0/2 lymph nodes negative, ER positive, PR positive, HER-2 negative IHC 0, T1CN0 stage Ia pathologic stage   04/23/2018 Oncotype testing   Recurrent score: 16, distant recurrence of 9 years: 4%   05/03/2018 - 05/30/2018 Radiation Therapy   Adjuvant radiation therapy in [place] (4256 cGy/16 boost 10 Gy/4)   06/04/2018 -  Anti-estrogen oral therapy   Adjuvant tamoxifen 20 mg daily     CHIEF COMPLIANT: Follow-up of left breast cancer on tamoxifen  INTERVAL HISTORY: Andrea Beltran is a 36 y.o. with above-mentioned history of left breast cancer diagnosed in [place] and treated with lumpectomy, radiation, and is currently on anti-estrogen therapy with tamoxifen. Mammogram on 04/27/2021 showed no evidence of malignancy bilaterally. She presents to the clinic today for follow-up.  She did not like the injection of Zoladex because it caused her severe pain and discomfort and therefore she stopped it.  Slight tenderness in the axilla and itching of the right nipple.  ALLERGIES:  is allergic to cat hair extract and pollen extract.  MEDICATIONS:  Current Outpatient Medications  Medication Sig Dispense Refill   D3-50 1.25 MG (50000 UT) capsule Take 50,000 Units by mouth once a week.     eszopiclone (LUNESTA) 2 MG TABS tablet TAKE 1 TABLET(2 MG) BY MOUTH  AT BEDTIME 90 tablet 1   fluticasone (FLONASE) 50 MCG/ACT nasal spray Place 2 sprays into both nostrils daily. 16 g 6   loratadine (CLARITIN) 10 MG tablet Take 10 mg by mouth daily.     montelukast (SINGULAIR) 10 MG tablet Take 1 tablet (10 mg total) by mouth at bedtime. 30 tablet 3   predniSONE (DELTASONE) 20 MG tablet Take 1 tablet (20 mg total) by mouth daily with breakfast. 5 tablet 0   tamoxifen (NOLVADEX) 10 MG tablet Once daily 90 tablet 3   No current facility-administered medications for this visit.    PHYSICAL EXAMINATION: ECOG PERFORMANCE STATUS: 1 - Symptomatic but completely ambulatory  Vitals:   08/05/21 1504  BP: 131/78  Pulse: 87  Resp: 18  Temp: (!) 97.5 F (36.4 C)  SpO2: 99%   Filed Weights   08/05/21 1504  Weight: 133 lb (60.3 kg)    BREAST: No palpable masses or nodules in either right or left breasts. No palpable axillary supraclavicular or infraclavicular adenopathy no breast tenderness or nipple discharge. (exam performed in the presence of a chaperone)  LABORATORY DATA:  I have reviewed the data as listed CMP Latest Ref Rng & Units 09/13/2019 03/26/2019  Glucose 70 - 99 mg/dL 97 96  BUN 6 - 23 mg/dL 12 10  Creatinine 0.40 - 1.20 mg/dL 0.64 0.64  Sodium 135 - 145 mEq/L 139 140  Potassium 3.5 - 5.1 mEq/L 3.8 3.9  Chloride 96 - 112 mEq/L 104 103  CO2 19 - 32 mEq/L 29 29  Calcium  8.4 - 10.5 mg/dL 9.4 9.7  Total Protein 6.0 - 8.3 g/dL 8.1 8.0  Total Bilirubin 0.2 - 1.2 mg/dL 0.4 0.4  Alkaline Phos 39 - 117 U/L 46 49  AST 0 - 37 U/L 23 21  ALT 0 - 35 U/L 14 14    Lab Results  Component Value Date   WBC 4.7 09/13/2019   HGB 13.0 09/13/2019   HCT 38.8 09/13/2019   MCV 90.8 09/13/2019   PLT 178.0 09/13/2019   NEUTROABS 1.7 09/13/2019    ASSESSMENT & PLAN:  Malignant neoplasm of upper-outer quadrant of left breast in female, estrogen receptor positive ([place]) 03/29/2018:Left lumpectomy at [facility] [place]: IDC grade  2, 1.8 x 1.6 x 1 cm, lymphovascular invasion present focally, 0/2 lymph nodes negative, ER positive, PR positive, HER-2 negative IHC 0, T1CN0 stage Ia pathologic stage Oncotype DX score 16: Risk of recurrence 4% Adjuvant radiation therapy 05/01/2018-05/30/2018   Current treatment: Adjuvant tamoxifen started 06/03/2018 Tamoxifen toxicities: 1.  Hot flashes and night sweats 2.  Fatigue and hair loss 3.  Emotional mood swings   She could not tolerate Zoladex because of the injection site discomfort.  Its been discontinued.  Breast cancer surveillance: 1. Mammogram 04/27/2021: Benign, breast density category D 2. Breast MRI 04/29/2019: No evidence of malignancy.   Last year breast MRI was rejected by her insurance.  Because of her lifetime risk of breast cancer prior to this breast cancer diagnosis was greater than 30%, I recommended that she get annual breast MRIs.  I ordered a new MRI.    Orders Placed This Encounter  Procedures   MR BREAST BILATERAL W WO CONTRAST INC CAD    Standing Status:   Future    Standing Expiration Date:   08/05/2022    Order Specific Question:   If indicated for the ordered procedure, I authorize the administration of contrast media per Radiology protocol    Answer:   Yes    Order Specific Question:   What is the patient's sedation requirement?    Answer:   No Sedation    Order Specific Question:   Does the patient have a pacemaker or implanted devices?    Answer:   No    Order Specific Question:   Preferred imaging location?    Answer:   [facility] (table limit-550lbs)   The patient has a good understanding of the overall plan. she agrees with it. she will call with any problems that may develop before the next visit here.  Total time spent: 20 mins including face to face time and time spent for planning, charting and coordination of care  Rulon Eisenmenger, MD, MPH 08/05/2021  I, Thana Ates, am acting as scribe for Dr. Nicholas Lose.  I have  reviewed the above documentation for accuracy and completeness, and I agree with the above.

## 2021-08-05 ENCOUNTER — Other Ambulatory Visit: Payer: Self-pay

## 2021-08-05 ENCOUNTER — Inpatient Hospital Stay: Payer: 59 | Attending: Hematology and Oncology | Admitting: Hematology and Oncology

## 2021-08-05 ENCOUNTER — Inpatient Hospital Stay: Payer: 59

## 2021-08-05 DIAGNOSIS — Z7952 Long term (current) use of systemic steroids: Secondary | ICD-10-CM | POA: Insufficient documentation

## 2021-08-05 DIAGNOSIS — N951 Menopausal and female climacteric states: Secondary | ICD-10-CM | POA: Diagnosis not present

## 2021-08-05 DIAGNOSIS — Z79899 Other long term (current) drug therapy: Secondary | ICD-10-CM | POA: Diagnosis not present

## 2021-08-05 DIAGNOSIS — C50412 Malignant neoplasm of upper-outer quadrant of left female breast: Secondary | ICD-10-CM | POA: Diagnosis present

## 2021-08-05 DIAGNOSIS — Z170 Estrogen receptor positive status [ER+]: Secondary | ICD-10-CM | POA: Diagnosis not present

## 2021-08-05 DIAGNOSIS — Z79810 Long term (current) use of selective estrogen receptor modulators (SERMs): Secondary | ICD-10-CM | POA: Insufficient documentation

## 2021-08-05 NOTE — Assessment & Plan Note (Signed)
03/29/2018:Left lumpectomy at [facility] [place]: IDC grade 2, 1.8 x 1.6 x 1 cm, lymphovascular invasion present focally, 0/2 lymph nodes negative, ER positive, PR positive, HER-2 negative IHC 0, T1CN0 stage Ia pathologic stage Oncotype DX score 16: Risk of recurrence 4% Adjuvant radiation therapy 05/01/2018-05/30/2018  Current treatment: Adjuvant tamoxifen started 06/03/2018 Tamoxifen toxicities: 1.Hot flashes and night sweats 2.Fatigue and hair loss 3.Emotional mood swings  We decided to initiate Zoladex once every 3 months August 2022 (but she has not continued these injections since then.  Breast cancer surveillance: 1.Mammogram 04/27/2021: Benign, breast density category D 2.Breast MRI 04/29/2019: No evidence of malignancy.  Patient has not had another breast MRI even though it was ordered.

## 2021-08-06 ENCOUNTER — Telehealth: Payer: Self-pay | Admitting: Hematology and Oncology

## 2021-08-06 NOTE — Telephone Encounter (Signed)
Scheduled appointment per 2/16 los. Patient is aware. Patient will be mailed an updated calendar.

## 2021-08-13 ENCOUNTER — Other Ambulatory Visit: Payer: Self-pay

## 2021-08-13 ENCOUNTER — Ambulatory Visit: Payer: 59 | Admitting: Family Medicine

## 2021-08-16 ENCOUNTER — Encounter: Payer: Self-pay | Admitting: Hematology and Oncology

## 2021-08-16 ENCOUNTER — Ambulatory Visit (INDEPENDENT_AMBULATORY_CARE_PROVIDER_SITE_OTHER): Payer: 59 | Admitting: Family Medicine

## 2021-08-16 ENCOUNTER — Encounter: Payer: Self-pay | Admitting: Family Medicine

## 2021-08-16 ENCOUNTER — Other Ambulatory Visit: Payer: Self-pay

## 2021-08-16 VITALS — BP 104/66 | HR 75 | Temp 97.8°F | Ht 66.0 in | Wt 132.2 lb

## 2021-08-16 DIAGNOSIS — C50412 Malignant neoplasm of upper-outer quadrant of left female breast: Secondary | ICD-10-CM

## 2021-08-16 DIAGNOSIS — G4701 Insomnia due to medical condition: Secondary | ICD-10-CM | POA: Diagnosis not present

## 2021-08-16 DIAGNOSIS — R1011 Right upper quadrant pain: Secondary | ICD-10-CM

## 2021-08-16 DIAGNOSIS — T451X5A Adverse effect of antineoplastic and immunosuppressive drugs, initial encounter: Secondary | ICD-10-CM

## 2021-08-16 DIAGNOSIS — J3089 Other allergic rhinitis: Secondary | ICD-10-CM

## 2021-08-16 DIAGNOSIS — Z170 Estrogen receptor positive status [ER+]: Secondary | ICD-10-CM

## 2021-08-16 DIAGNOSIS — R232 Flushing: Secondary | ICD-10-CM

## 2021-08-16 LAB — CBC
HCT: 38.9 % (ref 36.0–46.0)
Hemoglobin: 12.7 g/dL (ref 12.0–15.0)
MCHC: 32.6 g/dL (ref 30.0–36.0)
MCV: 91.1 fl (ref 78.0–100.0)
Platelets: 207 10*3/uL (ref 150.0–400.0)
RBC: 4.27 Mil/uL (ref 3.87–5.11)
RDW: 13.3 % (ref 11.5–15.5)
WBC: 4.4 10*3/uL (ref 4.0–10.5)

## 2021-08-16 LAB — COMPREHENSIVE METABOLIC PANEL
ALT: 18 U/L (ref 0–35)
AST: 28 U/L (ref 0–37)
Albumin: 4.4 g/dL (ref 3.5–5.2)
Alkaline Phosphatase: 50 U/L (ref 39–117)
BUN: 11 mg/dL (ref 6–23)
CO2: 29 mEq/L (ref 19–32)
Calcium: 9.5 mg/dL (ref 8.4–10.5)
Chloride: 103 mEq/L (ref 96–112)
Creatinine, Ser: 0.6 mg/dL (ref 0.40–1.20)
GFR: 116.57 mL/min (ref >60.00)
Glucose, Bld: 79 mg/dL (ref 70–99)
Potassium: 3.9 mEq/L (ref 3.5–5.1)
Sodium: 139 mEq/L (ref 135–145)
Total Bilirubin: 0.5 mg/dL (ref 0.2–1.2)
Total Protein: 8.4 g/dL — ABNORMAL HIGH (ref 6.0–8.3)

## 2021-08-16 LAB — LIPASE: Lipase: 21 U/L (ref 11.0–59.0)

## 2021-08-16 MED ORDER — DICYCLOMINE HCL 10 MG PO CAPS: 10.0000 mg | ORAL_CAPSULE | Freq: Three times a day (TID) | ORAL | 2 refills | Status: DC | PRN

## 2021-08-16 NOTE — Progress Notes (Signed)
[facility] [address] [place] [address] Dept: [phone] Dept Fax: [phone]  Chronic Care Office Visit  Subjective:    Patient ID: Andrea Beltran, female    DOB: [birth year], 36 y.o..   MRN: [id number]  Chief Complaint  Patient presents with   Follow-up    6 month f/u, no concerns.  Fasting today.      History of Present Illness:  Patient is in today for reassessment of chronic medical issues.  Ms. Andrea Beltran was diagnosed with left breast cancer in 2019 in [place]. She underwent lumpectomy followed by radiation. Her cancer is ER+, PR+, and HER2-. She is on tamoxifen. She has had a significant hot flashes associated with this. She tried Zoladex (goserelin) to try and suppress these symptoms, but did not tolerate the injections. She has had discussions with her oncologist about whether she may want to have children. She feels torn about this.   Ms. Andrea Beltran has a history of allergic rhinitis, with allergies to pollen and cat dander. She uses periodic Claritin and Flonase spray to control symptoms.She was having a flare of her allergy symptoms in Jan.  More consistent use of her Flonase has helped.   Ms. Andrea Beltran has a history of insomnia secondary to her tamoxifen use. She is currently managed on Lunesta.   Ms. Andrea Beltran notes an ongoing issue with periodic cramping in her RUQ. She was previously evaluated for this, both by her PCP and by a physician in [place]. She has had normal liver studies, two normal ultrasounds, and a normal HIDA scan. She notes her cramping is occurring more frequently. She particularly has noted this when eating dairy products (milk, cheese), butter, and greasy foods. She does note a general issue with constipation that she notes started once she started on tamoxifen.  Past Medical History: Patient Active Problem List   Diagnosis Date Noted   Hot flashes due to tamoxifen 11/09/2018   Malignant neoplasm of  upper-outer quadrant of left breast in female, estrogen receptor positive ([place]) 07/30/2018   Non-seasonal allergic rhinitis 02/10/2017   Insomnia 02/10/2017   Vitamin D deficiency 02/10/2017   Migraine without status migrainosus, not intractable 02/10/2017   Past Surgical History:  Procedure Laterality Date   BREAST LUMPECTOMY Left 2019   Family History  Problem Relation Age of Onset   Hypertension Mother    Stroke Father    Cancer Paternal Uncle        Brain   Breast cancer Maternal Grandmother    Cancer Maternal Grandmother        Breast, skin, kidney   Diabetes Paternal Grandfather    Outpatient Medications Prior to Visit  Medication Sig Dispense Refill   D3-50 1.25 MG (50000 UT) capsule Take 50,000 Units by mouth once a week.     eszopiclone (LUNESTA) 2 MG TABS tablet TAKE 1 TABLET(2 MG) BY MOUTH AT BEDTIME 90 tablet 1   fluticasone (FLONASE) 50 MCG/ACT nasal spray Place 2 sprays into both nostrils daily. 16 g 6   loratadine (CLARITIN) 10 MG tablet Take 10 mg by mouth daily.     montelukast (SINGULAIR) 10 MG tablet Take 1 tablet (10 mg total) by mouth at bedtime. 30 tablet 3   tamoxifen (NOLVADEX) 10 MG tablet Once daily 90 tablet 3   predniSONE (DELTASONE) 20 MG tablet Take 1 tablet (20 mg total) by mouth daily with breakfast. 5 tablet 0   No facility-administered medications prior to visit.   Allergies  Allergen Reactions   Cat Hair Extract Other (See Comments)    sneezing sneezing    Pollen Extract Other (See Comments)    Sneezing      Objective:   Today's Vitals   08/16/21 1122  BP: 104/66  Pulse: 75  Temp: 97.8 F (36.6 C)  SpO2: 98%  Weight: 132 lb 3.2 oz (60 kg)  Height: 5' 6" (1.676 m)   Body mass index is 21.34 kg/m.   General: Well developed, well nourished. No acute distress. Abdomen: Soft, non-tender. Bowel sounds positive, normal pitch and frequency. No hepatosplenomegaly.   No rebound or guarding. Psych: Alert and oriented. Normal mood and  affect.  Health Maintenance Due  Topic Date Due   Hepatitis C Screening  Never done   Lab Results     Assessment & Plan:   1. Malignant neoplasm of upper-outer quadrant of left breast in female, estrogen receptor positive ([place]) On tamoxifen. Hot flashes associated with the treatment remain an issue. She iwll continue to work with Dr. Lindi Adie (oncology) to try and manage these symptoms.  2. Non-seasonal allergic rhinitis, unspecified trigger Improved with more consistent use of Flonase.  3. Hot flashes due to tamoxifen As above.  4. Insomnia due to medical condition We will continue Lunesta for sleep.  5. Right upper quadrant abdominal pain Ms. Andrea Beltran has had pretty extensive work-up to exclude gallbladder disease. I will repeat albs, as her symptoms have been more prevalent recently. I wonder if she might be having some IBS as the underlying issue, which is being triggered by certain foods. I recommend she add a probiotic to see if this will help. I will also prescribe some Bentyl for management of symptoms.  - Comprehensive metabolic panel - CBC - Lipase - dicyclomine (BENTYL) 10 MG capsule; Take 1 capsule (10 mg total) by mouth 3 (three) times daily as needed for spasms.  Dispense: 30 capsule; Refill: 2  Return in about 6 months (around 02/13/2022) for Reassessment.   Haydee Salter, MD

## 2021-08-30 ENCOUNTER — Encounter: Payer: Self-pay | Admitting: Hematology and Oncology

## 2021-10-12 ENCOUNTER — Ambulatory Visit: Payer: Managed Care, Other (non HMO) | Admitting: Family Medicine

## 2021-10-12 ENCOUNTER — Encounter: Payer: Self-pay | Admitting: Hematology and Oncology

## 2021-10-12 VITALS — BP 118/66 | HR 90 | Temp 98.4°F | Ht 66.0 in | Wt 133.0 lb

## 2021-10-12 DIAGNOSIS — H6691 Otitis media, unspecified, right ear: Secondary | ICD-10-CM | POA: Diagnosis not present

## 2021-10-12 DIAGNOSIS — S92911A Unspecified fracture of right toe(s), initial encounter for closed fracture: Secondary | ICD-10-CM | POA: Diagnosis not present

## 2021-10-12 NOTE — Progress Notes (Signed)
?[facility] ?[address] ?[place] [address] ?Dept: [phone] ?Dept Fax: [phone] ? ?Office Visit ? ?Subjective:  ? ? Patient ID: CANDYCE GAMBINO, female    DOB: [birth year], 36 y.o..   MRN: [id number] ? ?Chief Complaint  ?Patient presents with  ? Acute Visit  ?  Wants Rt ear re-checked for infection. Seen urgent care 1 week ago.    ? ? ?History of Present Illness: ? ?Patient is in today for reassessment of her ear. She notes she went to Urgent Care on 4/18 with a generalized illness. She thought she might have the flu or COVID. Testing for these were negative. However, the NP she saw noted a right ear infection. She was treated with a course of antibiotics. She wants to be sure this cleared up. ? ?Also, on 4/21, she accidentally kicked her bedpost. She was seen at [facility] and had a fracture of the proximal phalanx of the right 2nd toe. She is currently in a stiff orthopedic shoe. She has noted some bruising and was not sure if this was normal. ? ?Past Medical History: ?Patient Active Problem List  ? Diagnosis Date Noted  ? Hot flashes due to tamoxifen 11/09/2018  ? Malignant neoplasm of upper-outer quadrant of left breast in female, estrogen receptor positive ([place]) 07/30/2018  ? Non-seasonal allergic rhinitis 02/10/2017  ? Insomnia 02/10/2017  ? Vitamin D deficiency 02/10/2017  ? Migraine without status migrainosus, not intractable 02/10/2017  ? ?Past Surgical History:  ?Procedure Laterality Date  ? BREAST LUMPECTOMY Left 2019  ? ?Family History  ?Problem Relation Age of Onset  ? Hypertension Mother   ? Stroke Father   ? Cancer Paternal Uncle   ?     Brain  ? Breast cancer Maternal Grandmother   ? Cancer Maternal Grandmother   ?     Breast, skin, kidney  ? Diabetes Paternal Grandfather   ? ?Outpatient Medications Prior to Visit  ?Medication Sig Dispense Refill  ? D3-50 1.25 MG (50000 UT) capsule Take 50,000 Units by mouth once a week.    ? eszopiclone  (LUNESTA) 2 MG TABS tablet TAKE 1 TABLET(2 MG) BY MOUTH AT BEDTIME 90 tablet 1  ? fluticasone (FLONASE) 50 MCG/ACT nasal spray Place 2 sprays into both nostrils daily. 16 g 6  ? loratadine (CLARITIN) 10 MG tablet Take 10 mg by mouth daily.    ? montelukast (SINGULAIR) 10 MG tablet Take 1 tablet (10 mg total) by mouth at bedtime. 30 tablet 3  ? tamoxifen (NOLVADEX) 10 MG tablet Once daily 90 tablet 3  ? dicyclomine (BENTYL) 10 MG capsule Take 1 capsule (10 mg total) by mouth 3 (three) times daily as needed for spasms. 30 capsule 2  ? ?No facility-administered medications prior to visit.  ? ?Allergies  ?Allergen Reactions  ? Cat Hair Extract Other (See Comments)  ?  sneezing ?sneezing ?  ? Pollen Extract Other (See Comments)  ?  Sneezing   ?   ?Objective:  ? ?Today's Vitals  ? 10/12/21 1549  ?BP: 118/66  ?Pulse: 90  ?Temp: 98.4 ?F (36.9 ?C)  ?TempSrc: Temporal  ?SpO2: 98%  ?Weight: 133 lb (60.3 kg)  ?Height: '5\' 6"'$  (1.676 m)  ? ?Body mass index is 21.47 kg/m?.  ? ?General: Well developed, well nourished. No acute distress. ?HEENT:  External ears normal. EAC and TMs normal bilaterally.  ?Extremities: Right 2nd and 3rd toe are buddy-taped. Minimal swelling and bruising noted outside of  ? bandage. ?  Psych: Alert and oriented. Normal mood and affect. ? ?Health Maintenance Due  ?Topic Date Due  ? Hepatitis C Screening  Never done  ?   ?Assessment & Plan:  ? ?1. Right otitis media, unspecified otitis media type ?Reviewed Urgent care visit note. Infection appears resolved a this point. ? ?2. Closed nondisplaced fracture of proximal phalanx of toe of right foot ?Reviewed Urgent care visit note and x-ray report. Continue to wear cast shoe and to buddy tape toe until pain resolved. Follow-up as needed. ? ?Return if symptoms worsen or fail to improve.  ? ?Haydee Salter, MD ?

## 2021-10-19 ENCOUNTER — Encounter: Payer: Self-pay | Admitting: Hematology and Oncology

## 2021-10-19 ENCOUNTER — Other Ambulatory Visit: Payer: Self-pay | Admitting: *Deleted

## 2021-10-19 DIAGNOSIS — Z170 Estrogen receptor positive status [ER+]: Secondary | ICD-10-CM

## 2021-10-28 ENCOUNTER — Encounter: Payer: Self-pay | Admitting: Hematology and Oncology

## 2021-11-09 ENCOUNTER — Other Ambulatory Visit: Payer: Self-pay | Admitting: Family Medicine

## 2021-11-09 DIAGNOSIS — G4701 Insomnia due to medical condition: Secondary | ICD-10-CM

## 2021-11-17 ENCOUNTER — Ambulatory Visit
Admission: RE | Admit: 2021-11-17 | Discharge: 2021-11-17 | Disposition: A | Discharge status: Home / Self Care | Payer: Managed Care, Other (non HMO) | Source: Ambulatory Visit | Attending: Hematology and Oncology | Admitting: Hematology and Oncology

## 2021-11-17 DIAGNOSIS — Z170 Estrogen receptor positive status [ER+]: Secondary | ICD-10-CM

## 2021-11-17 MED ORDER — GADOBUTROL 1 MMOL/ML IV SOLN
6.0000 mL | Freq: Once | INTRAVENOUS | Status: AC | PRN
  Administered 2021-11-17: 6 mL via INTRAVENOUS

## 2021-11-18 HISTORY — PX: BREAST BIOPSY: SHX20

## 2021-11-19 ENCOUNTER — Other Ambulatory Visit: Payer: Self-pay | Admitting: Hematology and Oncology

## 2021-11-19 ENCOUNTER — Encounter: Payer: Self-pay | Admitting: Hematology and Oncology

## 2021-11-19 DIAGNOSIS — R928 Other abnormal and inconclusive findings on diagnostic imaging of breast: Secondary | ICD-10-CM

## 2021-11-24 NOTE — Progress Notes (Signed)
HEMATOLOGY-ONCOLOGY TELEPHONE VISIT PROGRESS NOTE  I connected with Ms Winkel on 12/01/21 at  8:15 AM EDT by telephone and verified that I am speaking with the correct person using two identifiers.  I discussed the limitations, risks, security and privacy concerns of performing an evaluation and management service by telephone and the availability of in person appointments.  I also discussed with the patient that there may be a patient responsible charge related to this service. The patient expressed understanding and agreed to proceed.   History of Present Illness: Andrea Beltran is a 36 y.o. with above-mentioned history of left breast cancer. She presents to the clinic today via phone follow-up. She had a MRI breast that showed an abnormality that was biopsied and found to be benign. She's tolerating 10 mg Tamoxifen very well  Oncology History  Malignant neoplasm of upper-outer quadrant of left breast in female, estrogen receptor positive ([place])  03/29/2018 Surgery   Left lumpectomy at [facility] [place]: IDC grade 2, 1.8 x 1.6 x 1 cm, lymphovascular invasion present focally, 0/2 lymph nodes negative, ER positive, PR positive, HER-2 negative IHC 0, T1CN0 stage Ia pathologic stage   04/23/2018 Oncotype testing   Recurrent score: 16, distant recurrence of 9 years: 4%   05/03/2018 - 05/30/2018 Radiation Therapy   Adjuvant radiation therapy in [place] (4256 cGy/16 boost 10 Gy/4)   06/04/2018 -  Anti-estrogen oral therapy   Adjuvant tamoxifen 20 mg daily     REVIEW OF SYSTEMS:   Constitutional: Denies fevers, chills or abnormal weight loss All other systems were reviewed with the patient and are negative.   Assessment Plan:  Malignant neoplasm of upper-outer quadrant of left breast in female, estrogen receptor positive ([place]) 03/29/2018:Left lumpectomy at [facility] [place]: IDC grade 2, 1.8 x 1.6 x 1 cm, lymphovascular invasion present  focally, 0/2 lymph nodes negative, ER positive, PR positive, HER-2 negative IHC 0, T1CN0 stage Ia pathologic stage Oncotype DX score 16: Risk of recurrence 4% Adjuvant radiation therapy 05/01/2018-05/30/2018   Current treatment: Adjuvant tamoxifen started 06/03/2018 (10 mg dose) Tamoxifen toxicities: 1.  Hot flashes and night sweats 2.  Fatigue and hair loss 3.  Emotional mood swings Still no cycles.   She could not tolerate Zoladex because of the injection site discomfort.  Its been discontinued.   Breast cancer surveillance: 1. Mammogram 04/27/2021: Benign, breast density category D 2. Breast MRI 11/18/21: Right breast Mass 0.5 cm retro areolar mass: Biopsy: Benign  RTC in 1 year       I discussed the assessment and treatment plan with the patient. The patient was provided an opportunity to ask questions and all were answered. The patient agreed with the plan and demonstrated an understanding of the instructions. The patient was advised to call back or seek an in-person evaluation if the symptoms worsen or if the condition fails to improve as anticipated.   I provided 12 minutes of non-face-to-face time during this encounter. Harriette Ohara, MD  I Gardiner Coins am scribing for Dr. Lindi Adie  I have reviewed the above documentation for accuracy and completeness, and I agree with the above.

## 2021-11-25 ENCOUNTER — Encounter: Payer: Self-pay | Admitting: Hematology and Oncology

## 2021-11-25 ENCOUNTER — Ambulatory Visit
Admission: RE | Admit: 2021-11-25 | Discharge: 2021-11-25 | Disposition: A | Discharge status: Home / Self Care | Payer: Managed Care, Other (non HMO) | Source: Ambulatory Visit | Attending: Hematology and Oncology | Admitting: Hematology and Oncology

## 2021-11-25 ENCOUNTER — Ambulatory Visit
Admission: RE | Admit: 2021-11-25 | Discharge: 2021-11-25 | Disposition: A | Discharge status: Home / Self Care | Payer: 59 | Source: Ambulatory Visit | Attending: Hematology and Oncology | Admitting: Hematology and Oncology

## 2021-11-25 DIAGNOSIS — R928 Other abnormal and inconclusive findings on diagnostic imaging of breast: Secondary | ICD-10-CM

## 2021-11-25 IMAGING — MG MM BREAST LOCALIZATION CLIP
4 series · 4 of 12 positions shown · non-contrast
Comparison: Previous exam(s).

CLINICAL DATA: MR guided core needle biopsy of a 7 mm right lateral
retroareolar mass seen on recent high risk screening MRI. The mass
was more central retroareolar at the time of biopsy today.

EXAM:
3D DIAGNOSTIC RIGHT MAMMOGRAM POST ULTRASOUND BIOPSY

[R ML synth-2D]
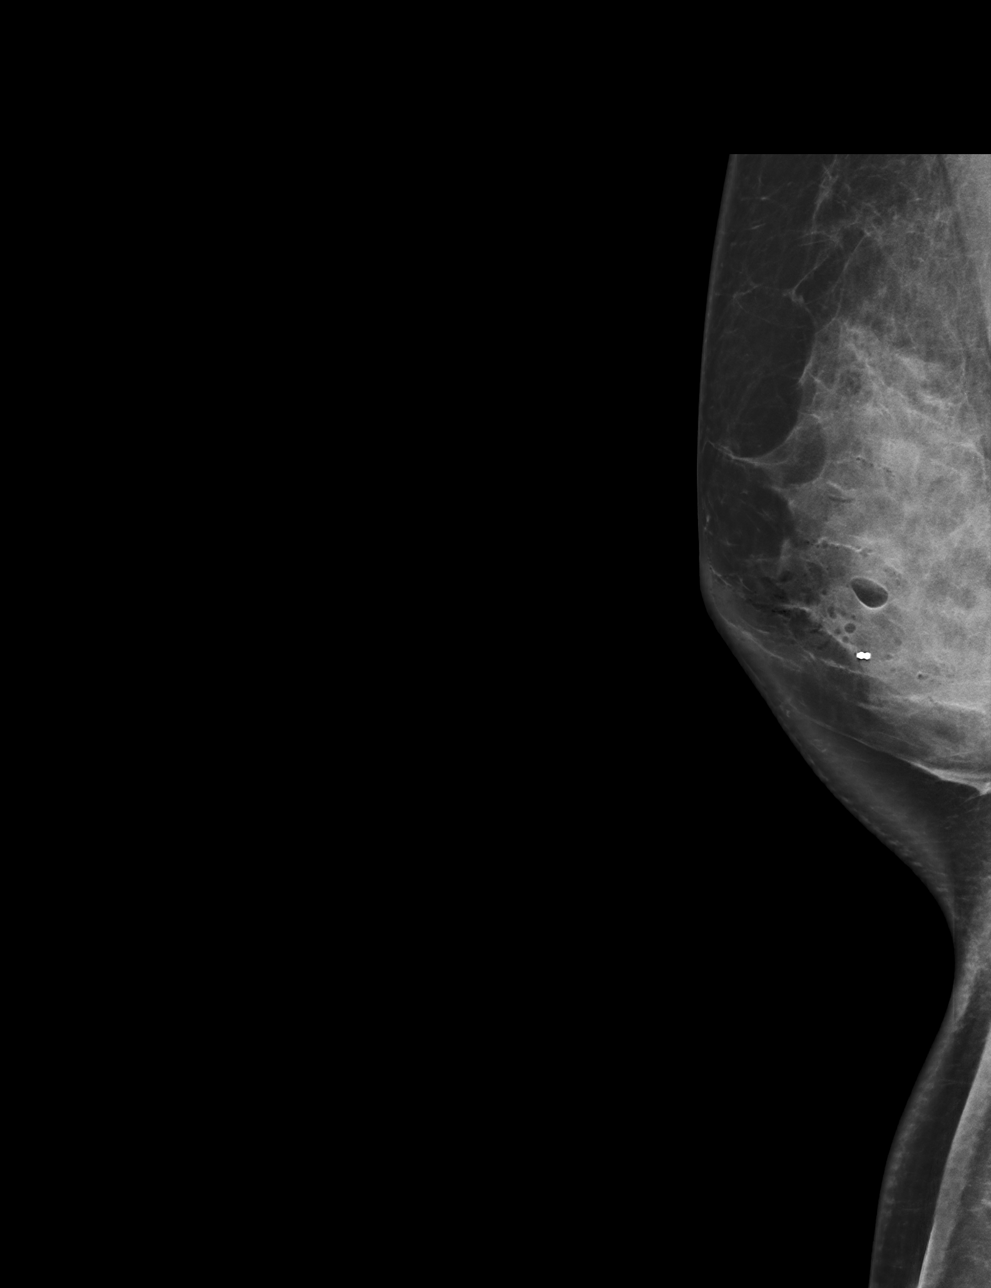

[R CC synth-2D]
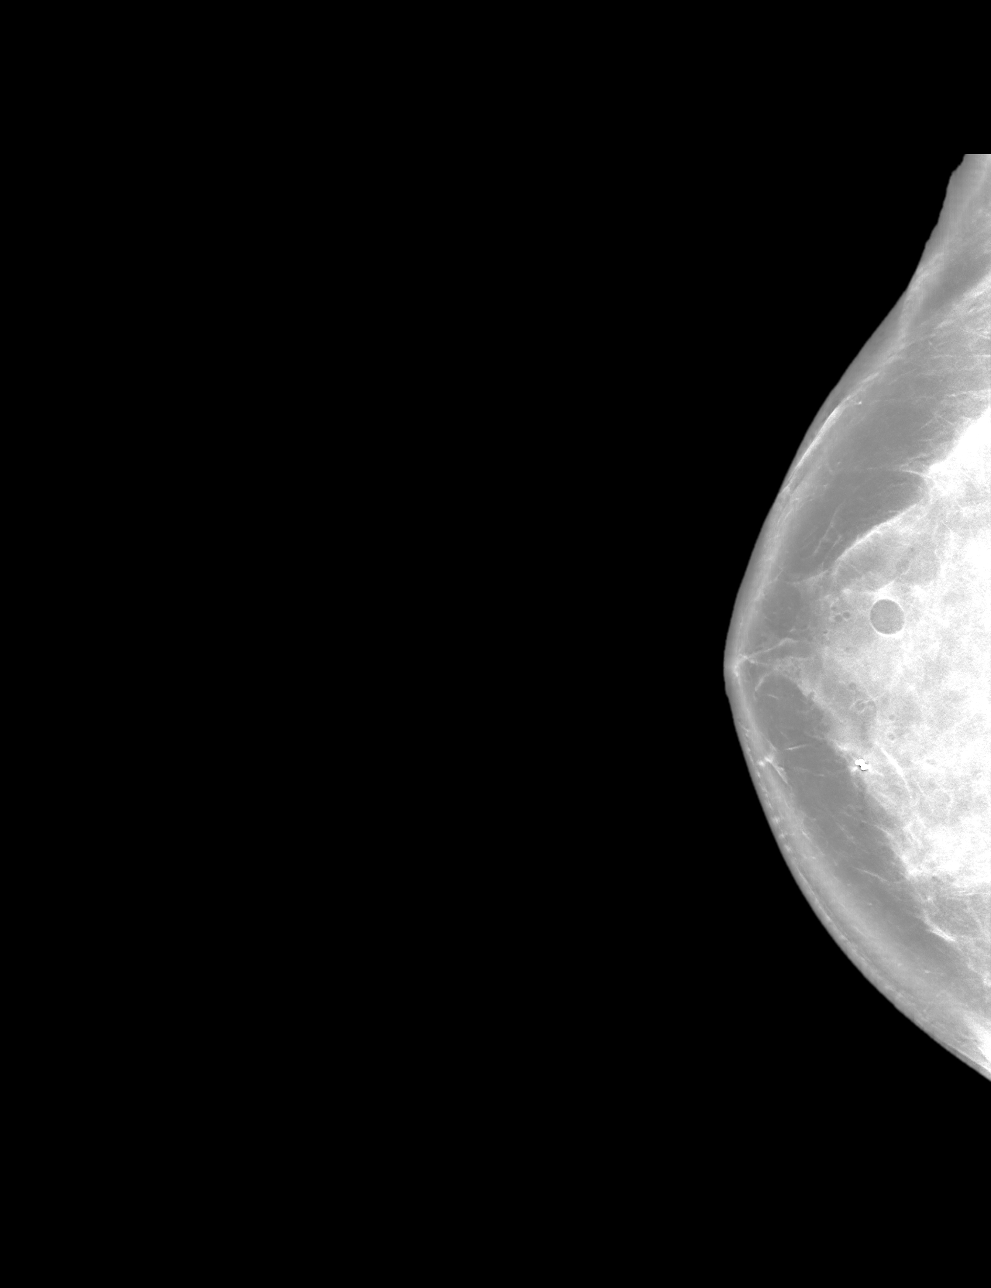

[R CC tomo · tomo slice 47/93.0]
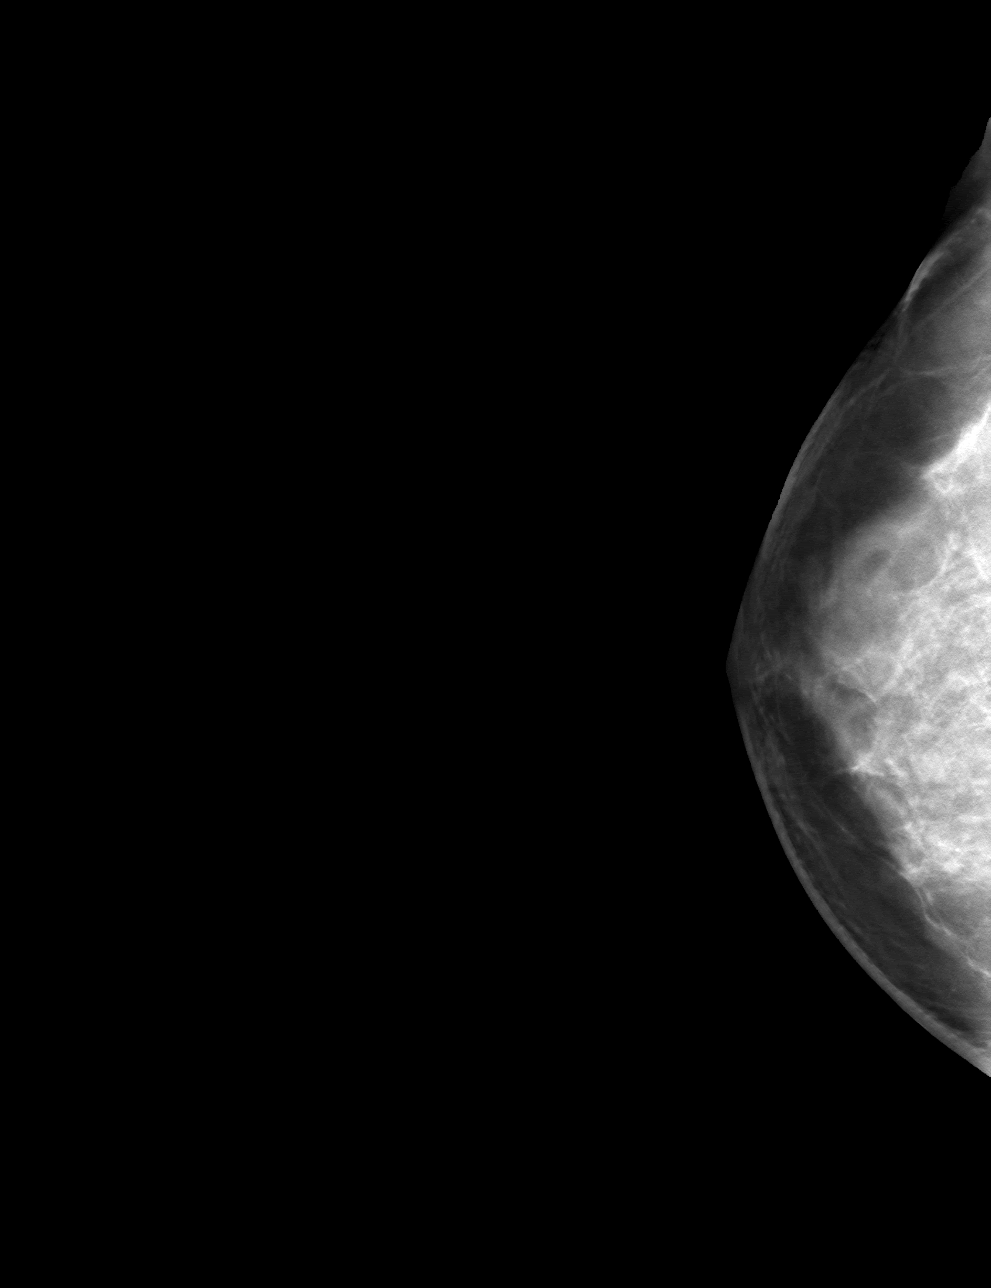

[R ML tomo · tomo slice 43/84.0]
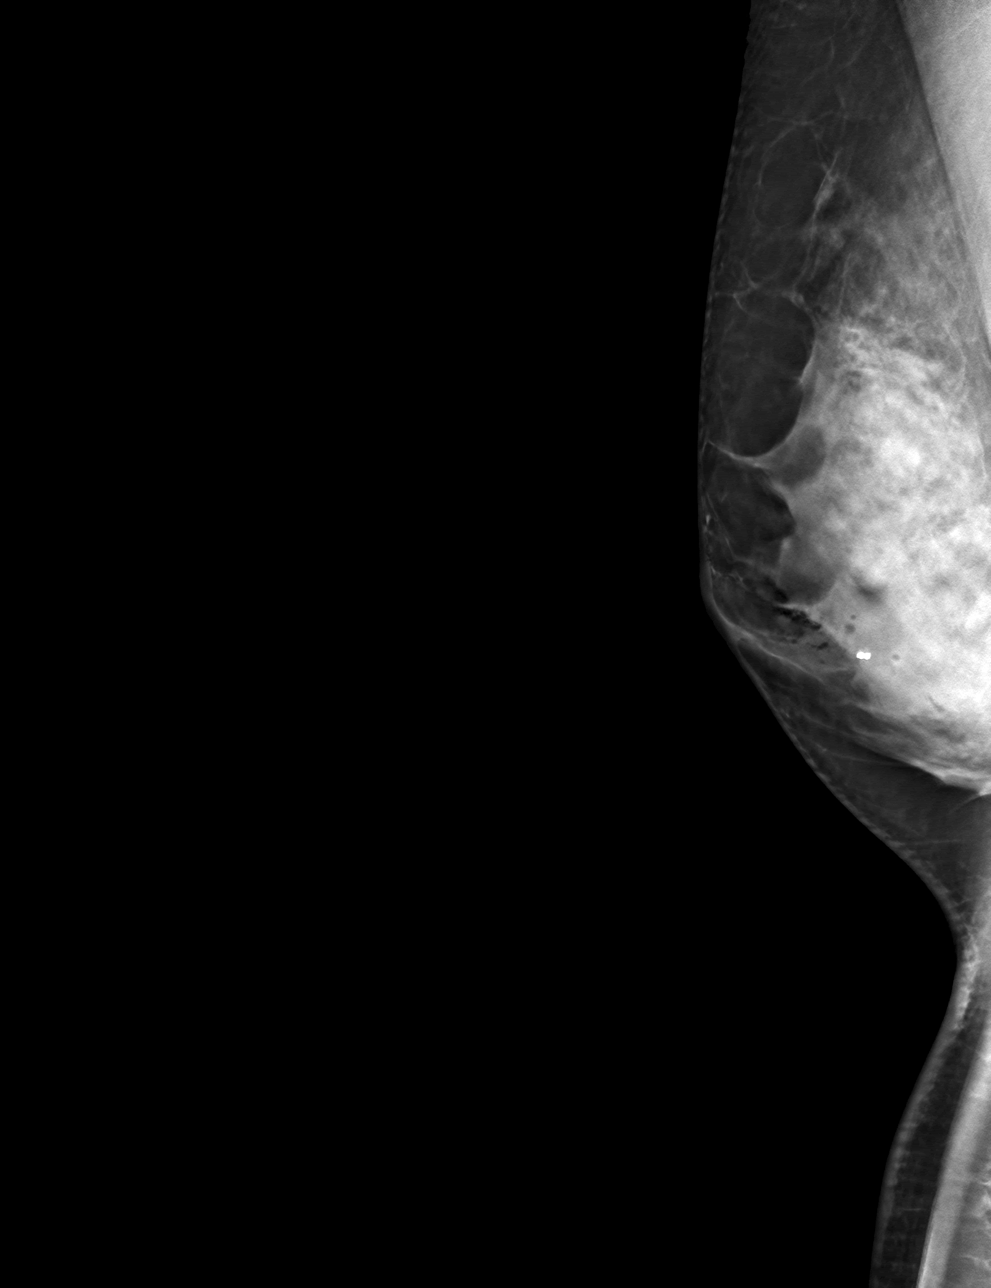

[4 of 12 positions shown; findings below may reference images not displayed]

FINDINGS: 3D Mammographic images were obtained following MR guided biopsy of
recently demonstrated 7 mm retroareolar right breast mass. The
biopsy marking clip is inferiorly and medially displaced relative to
the location of the biopsied mass with biopsy changes demonstrated
at location of the biopsied mass.
IMPRESSION: 1.4 cm of medial and 1.0 cm of inferior displacement of the barbell
shaped biopsy marker clip relative to the location of the biopsied
mass.

Final Assessment: Post Procedure Mammograms for Marker Placement

## 2021-11-25 IMAGING — MR MR BREAST BX W/ LOC DEV 1ST LEASION IMAGE BX SPEC MR GUIDE*R*
8 of 12 series · 32 of 48 positions shown · IV contrast (6ml gadavist)
Comparison: None Available.
COMPARISON: None Available.

Addendum:
CLINICAL DATA: 7 mm lateral retroareolar right breast mass on a
recent high risk screening MRI of the breasts. Status post left
lumpectomy and radiation therapy for breast cancer in [CN]. Family
history of breast cancer in her grandmother.

EXAM:
MRI GUIDED CORE NEEDLE BIOPSY OF THE RIGHT BREAST
TECHNIQUE: Multiplanar, multisequence MR imaging of the right breast was
performed both before and after administration of intravenous
contrast.
CONTRAST:  6mL GADAVIST GADOBUTROL 1 MMOL/ML IV SOLN

[Series 2: fiducial unilateral · sagittal · 2.0mm · 1.33mm/px · 2 of 52 slices shown]
[im 1/52]
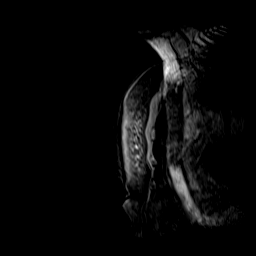
[im 52/52]
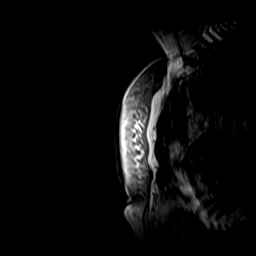

[Series 3: dynamic pre · axial · non-contrast · 1.3mm · 0.73mm/px · z∈[-109,+98]mm · 5 of 160 slices shown]
[im 1/160]
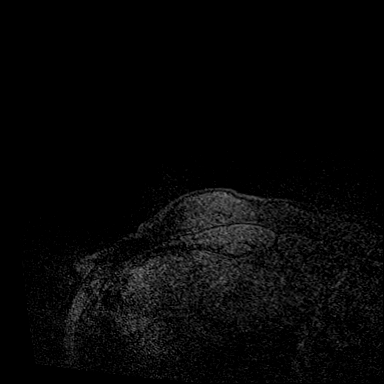
[im 40/160]
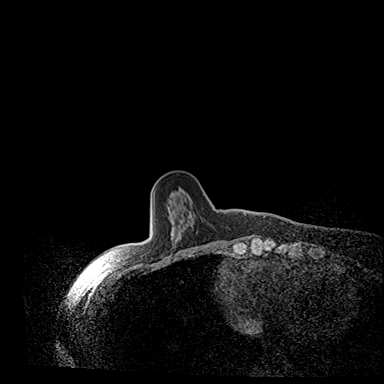
[im 80/160]
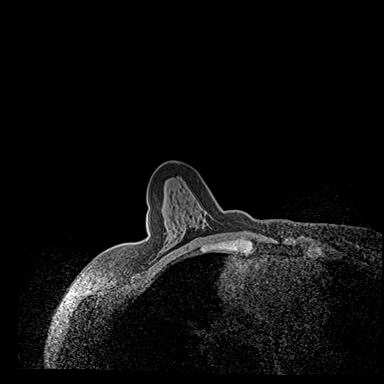
[im 120/160]
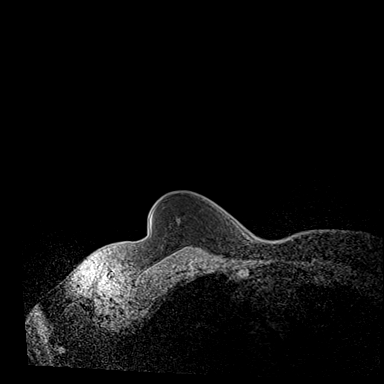
[im 160/160]
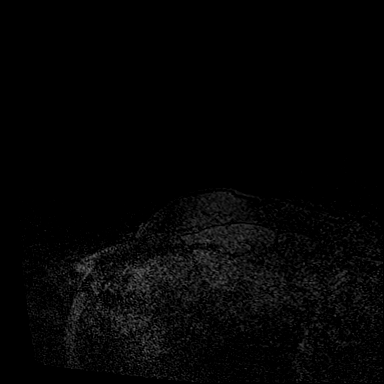

[Series 4: dynamic post 20 · axial · 1.3mm · 0.73mm/px · z∈[-109,+98]mm · 5 of 160 slices shown (1 of 2)]
[im 1/160]
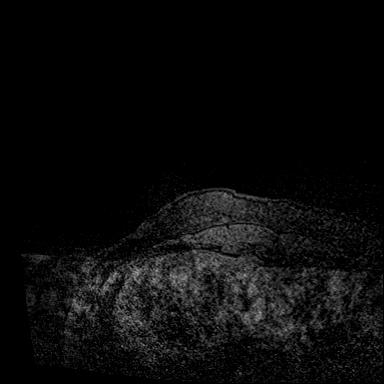
[im 40/160]
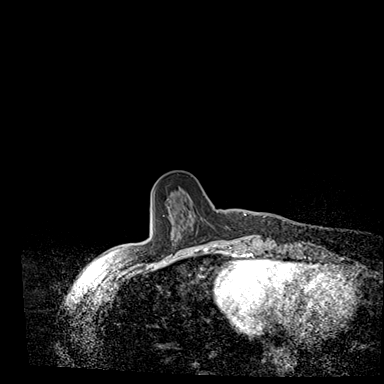
[im 80/160]
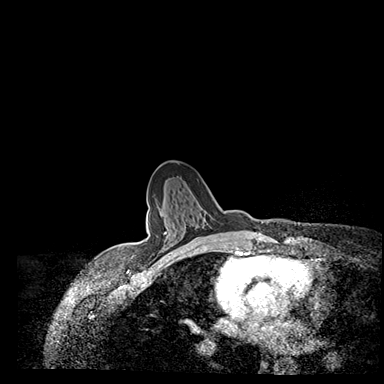
[im 120/160]
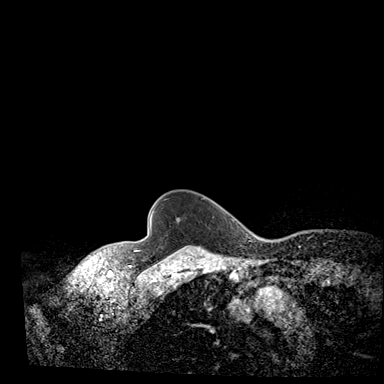
[im 160/160]
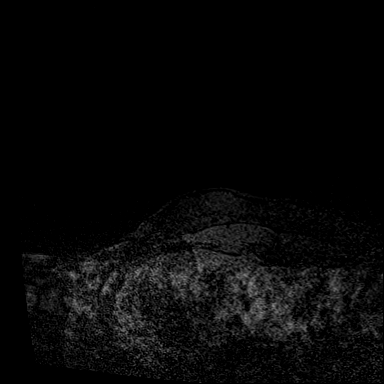

[Series 5: dynamic post 20 · axial · 1.3mm · 0.73mm/px · z∈[-109,+98]mm · 4 of 160 slices shown (2 of 2)]
[im 1/160]
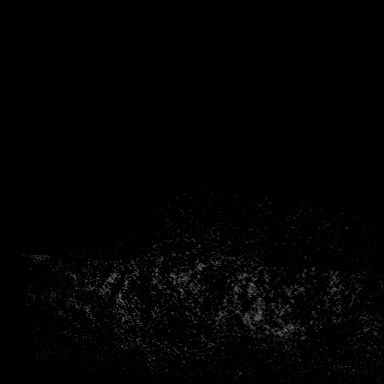
[im 54/160]
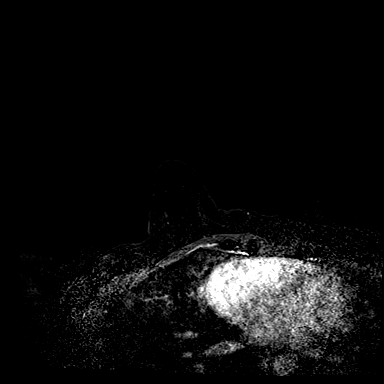
[im 107/160]
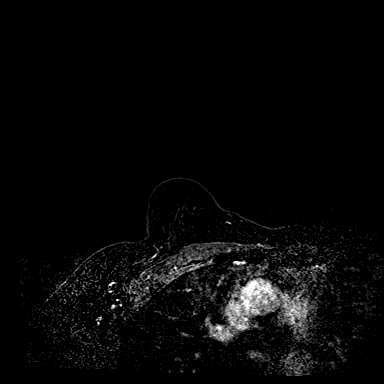
[im 160/160]
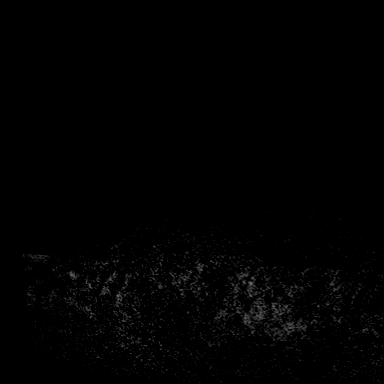

[Series 6: dynamic post 3 · axial · 1.3mm · 0.73mm/px · z∈[-109,+98]mm · 4 of 160 slices shown (1 of 2)]
[im 1/160]
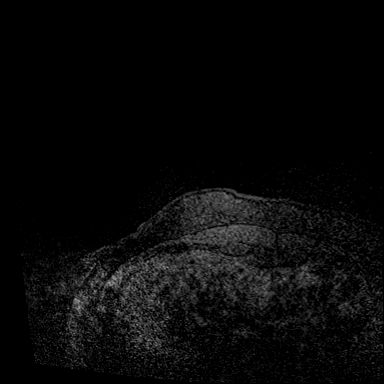
[im 54/160]
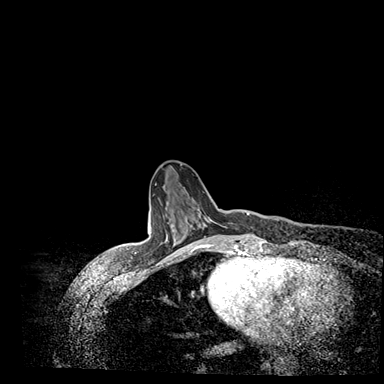
[im 107/160]
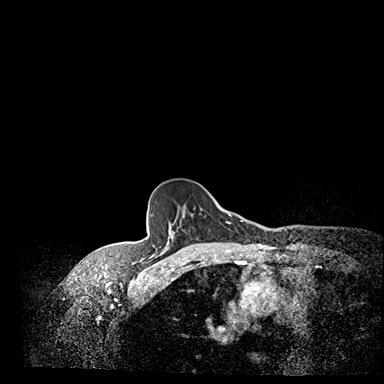
[im 160/160]
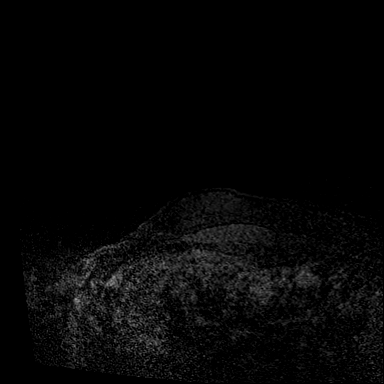

[Series 7: dynamic post 3 · axial · 1.3mm · 0.73mm/px · z∈[-109,+98]mm · 4 of 160 slices shown (2 of 2)]
[im 1/160]
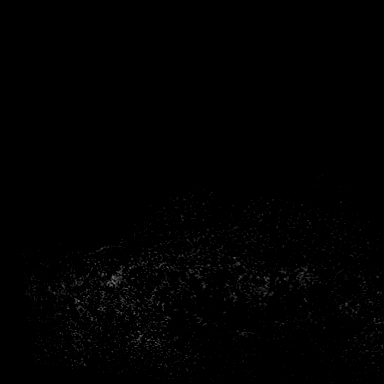
[im 54/160]
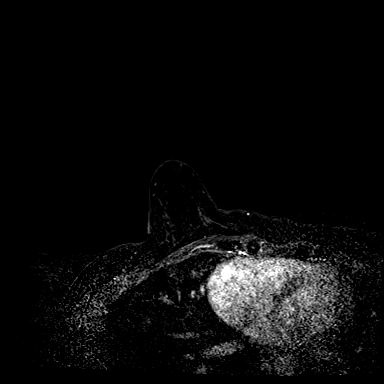
[im 107/160]
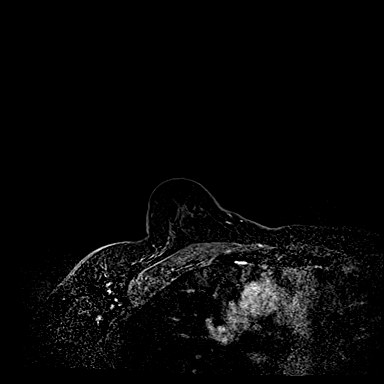
[im 160/160]
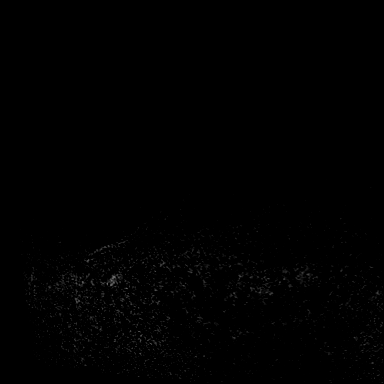

[Series 8: needle confirmation · axial · 1.3mm · 0.73mm/px · z∈[-109,+98]mm · 4 of 160 slices shown]
[im 1/160]
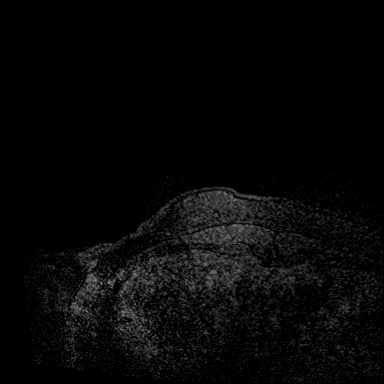
[im 54/160]
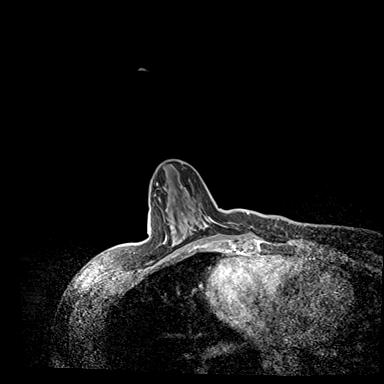
[im 107/160]
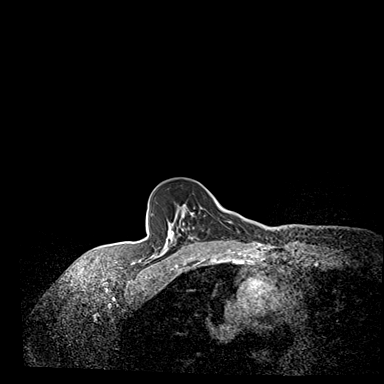
[im 160/160]
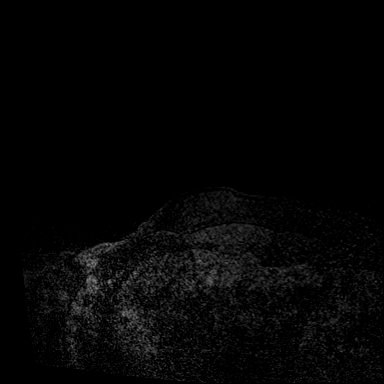

[Series 9: needle confirmation_sub · axial · 1.3mm · 0.73mm/px · z∈[-109,+98]mm · 4 of 160 slices shown]
[im 1/160]
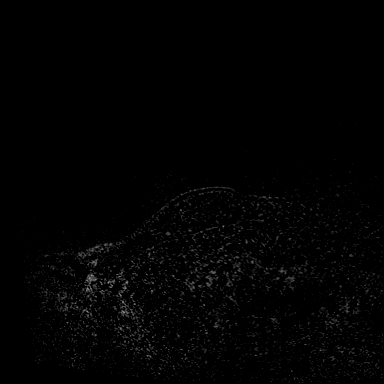
[im 54/160]
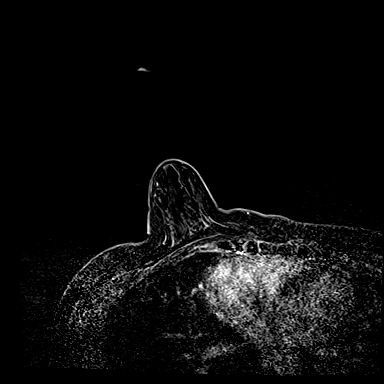
[im 107/160]
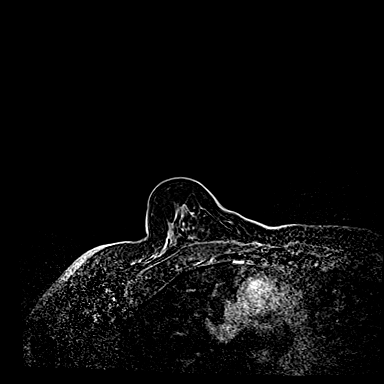
[im 160/160]
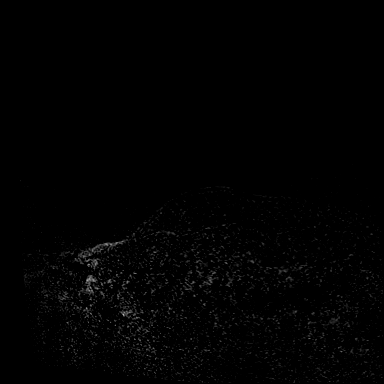

[32 of 48 positions shown; findings below may reference images not displayed]

FINDINGS: I met with the patient, and we discussed the procedure of MRI guided
biopsy, including risks, benefits, and alternatives. Specifically,
we discussed the risks of infection, bleeding, tissue injury, clip
migration, and inadequate sampling. Informed, written consent was
given. The usual time out protocol was performed immediately prior
to the procedure.

Using sterile technique, 1% Lidocaine, MRI guidance, and a 9 gauge
vacuum assisted device, biopsy was performed of the recently
demonstrated 7 mm lateral retroareolar right breast mass using a
lateral approach. The mass was more central retroareolar on today's
images prior to biopsy. At the conclusion of the procedure, a
barbell shaped tissue marker clip was deployed into the biopsy
cavity. Follow-up 2-view mammogram was performed and dictated
separately.
IMPRESSION: MRI guided biopsy of the recently demonstrated 7 mm retroareolar
right breast mass. No apparent complications.

ADDENDUM:
Pathology revealed BENIGN BREAST TISSUE WITH DENSE STROMAL FIBROSIS
AND FIBROCYSTIC CHANGE of the RIGHT breast, lateral retroareolar,
(barbell clip). This was found to be concordant by Dr. LUCAS.

Pathology results were discussed with the patient by telephone. The
patient reported doing well after the biopsy with tenderness and
bleeding at the site. Post biopsy instructions and care were
reviewed and questions were answered. The patient was encouraged to
call The [REDACTED] for any additional
concerns. My direct phone number was provided.

The patient was instructed to return for a bilateral breast MRI in 6
months, per protocol.

The patient was instructed to return for annual screening
mammography in [DATE].

Pathology results reported by LUCAS, RN on [DATE].

*** End of Addendum ***
FINDINGS: I met with the patient, and we discussed the procedure of MRI guided
biopsy, including risks, benefits, and alternatives. Specifically,
we discussed the risks of infection, bleeding, tissue injury, clip
migration, and inadequate sampling. Informed, written consent was
given. The usual time out protocol was performed immediately prior
to the procedure.

Using sterile technique, 1% Lidocaine, MRI guidance, and a 9 gauge
vacuum assisted device, biopsy was performed of the recently
demonstrated 7 mm lateral retroareolar right breast mass using a
lateral approach. The mass was more central retroareolar on today's
images prior to biopsy. At the conclusion of the procedure, a
barbell shaped tissue marker clip was deployed into the biopsy
cavity. Follow-up 2-view mammogram was performed and dictated
separately.
IMPRESSION: MRI guided biopsy of the recently demonstrated 7 mm retroareolar
right breast mass. No apparent complications.

## 2021-11-25 MED ORDER — GADOBUTROL 1 MMOL/ML IV SOLN
6.0000 mL | Freq: Once | INTRAVENOUS | Status: AC | PRN
  Administered 2021-11-25: 6 mL via INTRAVENOUS

## 2021-12-01 ENCOUNTER — Inpatient Hospital Stay: Payer: 59 | Attending: Hematology and Oncology | Admitting: Hematology and Oncology

## 2021-12-01 DIAGNOSIS — C50412 Malignant neoplasm of upper-outer quadrant of left female breast: Secondary | ICD-10-CM | POA: Diagnosis not present

## 2021-12-01 DIAGNOSIS — Z170 Estrogen receptor positive status [ER+]: Secondary | ICD-10-CM | POA: Diagnosis not present

## 2021-12-01 NOTE — Assessment & Plan Note (Signed)
03/29/2018:Left lumpectomy at [facility] [place]: IDC grade 2, 1.8 x 1.6 x 1 cm, lymphovascular invasion present focally, 0/2 lymph nodes negative, ER positive, PR positive, HER-2 negative IHC 0, T1CN0 stage Ia pathologic stage Oncotype DX score 16: Risk of recurrence 4% Adjuvant radiation therapy 05/01/2018-05/30/2018  Current treatment: Adjuvant tamoxifen started 06/03/2018 Tamoxifen toxicities: 1.Hot flashes and night sweats 2.Fatigue and hair loss 3.Emotional mood swings  She could not tolerate Zoladex because of the injection site discomfort.  Its been discontinued.  Breast cancer surveillance: 1.Mammogram 04/27/2021: Benign, breast density category D 2.Breast MRI 11/18/21: Mass 0.5 cm retro areolar mass: Biopsy: Benign  RTC in 1 year

## 2021-12-07 ENCOUNTER — Ambulatory Visit (INDEPENDENT_AMBULATORY_CARE_PROVIDER_SITE_OTHER): Payer: Managed Care, Other (non HMO) | Admitting: Family Medicine

## 2021-12-07 VITALS — BP 120/76 | HR 90 | Temp 97.6°F | Ht 66.0 in | Wt 135.4 lb

## 2021-12-07 DIAGNOSIS — R350 Frequency of micturition: Secondary | ICD-10-CM | POA: Diagnosis not present

## 2021-12-07 DIAGNOSIS — M26621 Arthralgia of right temporomandibular joint: Secondary | ICD-10-CM

## 2021-12-07 DIAGNOSIS — G4701 Insomnia due to medical condition: Secondary | ICD-10-CM

## 2021-12-07 MED ORDER — ZOLPIDEM TARTRATE 5 MG PO TABS: 5.0000 mg | ORAL_TABLET | Freq: Every evening | ORAL | 0 refills | Status: DC | PRN

## 2021-12-07 NOTE — Patient Instructions (Signed)
Chew softer foods for the next several days. Apply warm compress to jaw for 5-10 min. three times a day. May use ibuprofen if needed.

## 2021-12-07 NOTE — Progress Notes (Signed)
[facility] [address] [place] [address] Dept: [phone] Dept Fax: [phone]  Office Visit  Subjective:    Patient ID: Andrea Beltran, female    DOB: [birth year], 36 y.o..   MRN: [id number]  Chief Complaint  Patient presents with   Acute Visit    C/o having jaw pain/RT ear pain when chewing food x 2 days.   Taken Ibuprofen with little relief.      History of Present Illness:  Patient is in today for assessment of a 2-3 day history of right jaw pain. She states this initially occurred while eating. She wasn't sure if she was also having some ear pain or not. She had not been chewing anything tough or hard. she does not chew gum. She does not have any dental issues she is aware of.  Andrea Beltran is planning to go to [place] in August. She has a history of insomnia associated with vasomotor symptoms she gets from her tamoxifen treatment. She is worried about how she will do there. Although she uses Lunesta nightly, she wonders about having something stronger to use during her trip.  Andrea Beltran also notes that she urinates frequently. She feels this is a side effect of the tamoxifen as well. She notes it is hard to find a public restroom in [place]. She wonders if there is a medication that could help her to urinate less.  Past Medical History: Patient Active Problem List   Diagnosis Date Noted   Hot flashes due to tamoxifen 11/09/2018   Malignant neoplasm of upper-outer quadrant of left breast in female, estrogen receptor positive ([place]) 07/30/2018   Non-seasonal allergic rhinitis 02/10/2017   Insomnia 02/10/2017   Vitamin D deficiency 02/10/2017   Migraine without status migrainosus, not intractable 02/10/2017   Past Surgical History:  Procedure Laterality Date   BREAST LUMPECTOMY Left 2019   Family History  Problem Relation Age of Onset   Hypertension Mother    Stroke Father    Cancer Paternal Uncle        Brain    Breast cancer Maternal Grandmother    Cancer Maternal Grandmother        Breast, skin, kidney   Diabetes Paternal Grandfather    Outpatient Medications Prior to Visit  Medication Sig Dispense Refill   D3-50 1.25 MG (50000 UT) capsule Take 50,000 Units by mouth once a week.     eszopiclone (LUNESTA) 2 MG TABS tablet TAKE 1 TABLET(2 MG) BY MOUTH AT BEDTIME 90 tablet 1   fluticasone (FLONASE) 50 MCG/ACT nasal spray Place 2 sprays into both nostrils daily. 16 g 6   loratadine (CLARITIN) 10 MG tablet Take 10 mg by mouth daily.     montelukast (SINGULAIR) 10 MG tablet Take 1 tablet (10 mg total) by mouth at bedtime. 30 tablet 3   tamoxifen (NOLVADEX) 10 MG tablet Once daily 90 tablet 3   No facility-administered medications prior to visit.   Allergies  Allergen Reactions   Cat Hair Extract Other (See Comments)    sneezing sneezing    Pollen Extract Other (See Comments)    Sneezing      Objective:   Today's Vitals   12/07/21 1421  BP: 120/76  Pulse: 90  Temp: 97.6 F (36.4 C)  TempSrc: Temporal  SpO2: 96%  Weight: 135 lb 6.4 oz (61.4 kg)  Height: '5\' 6"'$  (1.676 m)   Body mass index is 21.85 kg/m.   General: Well developed, well  nourished. No acute distress. HEENT: Normocephalic, nontraumatic. External ears are normal. There is no increased pain with   tragal manipulation on the right. The right EAC and TM are normal. There is mild pain on palpation   over the right TMJ. No subluxation or dislocation noted. The teeth are normal There are two small   white papules present on the right tonsil. Neck: Supple. No lymphadenopathy. Psych: Alert and oriented. Normal mood and affect.  Health Maintenance Due  Topic Date Due   Hepatitis C Screening  Never done     Assessment & Plan:   1. Arthralgia of right temporomandibular joint This is likely due to a temporary flare of pain in the joint. I recommend she chew soft foods only for a few days. apply hot compresses several times a  day, and use an NSAID if needed. Follow-up if not improving.  2. Insomnia due to medical condition I will prescribe a short course of Ambien for use while on her trip as a substitute for her Lunesta.  - zolpidem (AMBIEN) 5 MG tablet; Take 1 tablet (5 mg total) by mouth at bedtime as needed for sleep.  Dispense: 5 tablet; Refill: 0  3. Urinary frequency I recommend she try to limit her water intake when she knows she will be out walking, away from where she is staying. We discussed that other medications with antidiuretic properties would not be indicated for such a short term of use.  Return if symptoms worsen or fail to improve.   Haydee Salter, MD

## 2021-12-24 ENCOUNTER — Encounter: Payer: Self-pay | Admitting: Hematology and Oncology

## 2021-12-24 ENCOUNTER — Encounter: Payer: Self-pay | Admitting: Family Medicine

## 2021-12-24 ENCOUNTER — Ambulatory Visit: Payer: Managed Care, Other (non HMO) | Admitting: Family Medicine

## 2021-12-24 VITALS — BP 114/68 | HR 70 | Temp 97.8°F | Ht 66.0 in | Wt 138.6 lb

## 2021-12-24 DIAGNOSIS — K602 Anal fissure, unspecified: Secondary | ICD-10-CM | POA: Diagnosis not present

## 2021-12-24 DIAGNOSIS — K5903 Drug induced constipation: Secondary | ICD-10-CM | POA: Diagnosis not present

## 2021-12-24 MED ORDER — NITROGLYCERIN 0.4 % RE OINT: TOPICAL_OINTMENT | RECTAL | 1 refills | Status: DC

## 2021-12-24 NOTE — Progress Notes (Signed)
[facility] [address] [place] [address] Dept: [phone] Dept Fax: [phone]  Office Visit  Subjective:    Patient ID: Andrea Beltran, female    DOB: Sep 16, 1985, 36 y.o..   MRN: [id number]  Chief Complaint  Patient presents with   Acute Visit    C/o having constipation x 1 month.  With some bleeding with bowel movements.      History of Present Illness:  Patient is in today for complaining of a possible anal fissure. Andrea Beltran has had recurrent issues with constipation since being on tamoxifen. She notes that she has had an anal fissure in the past. Over the last month she has had rectal pain, itching, and occasional bleeding with bowel movements. She notes her constipation has been doing better, as she has been fixing herself a Green Latte (spinach, banana, Mate, chia seeds, and honey) in the morning.  Past Medical History: Patient Active Problem List   Diagnosis Date Noted   Hot flashes due to tamoxifen 11/09/2018   Malignant neoplasm of upper-outer quadrant of left breast in female, estrogen receptor positive ([place]) 07/30/2018   Non-seasonal allergic rhinitis 02/10/2017   Insomnia 02/10/2017   Vitamin D deficiency 02/10/2017   Migraine without status migrainosus, not intractable 02/10/2017   Past Surgical History:  Procedure Laterality Date   BREAST LUMPECTOMY Left 2019   Family History  Problem Relation Age of Onset   Hypertension Mother    Stroke Father    Cancer Paternal Uncle        Brain   Breast cancer Maternal Grandmother    Cancer Maternal Grandmother        Breast, skin, kidney   Diabetes Paternal Grandfather    Outpatient Medications Prior to Visit  Medication Sig Dispense Refill   D3-50 1.25 MG (50000 UT) capsule Take 50,000 Units by mouth once a week.     eszopiclone (LUNESTA) 2 MG TABS tablet TAKE 1 TABLET(2 MG) BY MOUTH AT BEDTIME 90 tablet 1   fluticasone (FLONASE) 50 MCG/ACT nasal  spray Place 2 sprays into both nostrils daily. 16 g 6   loratadine (CLARITIN) 10 MG tablet Take 10 mg by mouth daily.     montelukast (SINGULAIR) 10 MG tablet Take 1 tablet (10 mg total) by mouth at bedtime. 30 tablet 3   tamoxifen (NOLVADEX) 10 MG tablet Once daily 90 tablet 3   zolpidem (AMBIEN) 5 MG tablet Take 1 tablet (5 mg total) by mouth at bedtime as needed for sleep. 5 tablet 0   No facility-administered medications prior to visit.   Allergies  Allergen Reactions   Cat Hair Extract Other (See Comments)    sneezing sneezing    Pollen Extract Other (See Comments)    Sneezing    Objective:   Today's Vitals   12/24/21 1509  BP: 114/68  Pulse: 70  Temp: 97.8 F (36.6 C)  TempSrc: Temporal  SpO2: 98%  Weight: 138 lb 9.6 oz (62.9 kg)  Height: '5\' 6"'$  (1.676 m)   Body mass index is 22.37 kg/m.   General: Well developed, well nourished. No acute distress. Rectum: There is a small fissure noted at the 12 o'clock position of the rectum. No active bleeding. there is a small anal   tag without redness or irritation at the 6 o'clock position. Psych: Alert and oriented. Normal mood and affect.  Health Maintenance Due  Topic Date Due   Hepatitis C Screening  Never done  Assessment & Plan:   1. Anal fissure Andrea Beltran has already been using OTC ointments. I will add a nitroglycerin ointment to this regimen to see if we can speed healing of the fissure.  - Nitroglycerin 0.4 % OINT; Apply 1 inch (375 mg) ointment intra-anally every 12 hours for anal fissure  Dispense: 30 g; Refill: 1  2. Drug-induced constipation Andrea Beltran is following a good regimen now for managing her constipation with dietary fiber sources.  Return if symptoms worsen or fail to improve.   Haydee Salter, MD

## 2021-12-24 NOTE — Patient Instructions (Signed)
Anal Fissure, Adult  An anal fissure is a small tear or crack in the tissue of the anus. Bleeding from a fissure usually stops on its own within a few minutes. However, bleeding will often occur again with each bowel movement until the fissure heals. What are the causes? This condition is usually caused by passing a large or hard stool (feces). Other causes include: Constipation. Frequent diarrhea. Inflammatory bowel disease (Crohn's disease or ulcerative colitis). Childbirth. Infections. Anal sex. What are the signs or symptoms? Symptoms of this condition include: Bleeding from the rectum. Small amounts of blood seen on your stool, on the toilet paper, or in the toilet after a bowel movement. The blood coats the outside of the stool and is not mixed with the stool. Painful bowel movements. Itching or irritation around the anus. How is this diagnosed? A health care provider may diagnose this condition by closely examining the anal area. An anal fissure can usually be seen with careful inspection. In some cases, a rectal exam may be performed, or a short tube (anoscope) may be used to examine the anal canal. How is this treated? Initial treatment for this condition may include: Taking steps to avoid constipation. This may include making changes to your diet, such as increasing your intake of fiber or fluid. Taking fiber supplements. These supplements can soften your stool to help make bowel movements easier. Your health care provider may also prescribe a stool softener if your stool is hard. Taking sitz baths. This may help to heal the tear. Using medicated creams or ointments. These may be prescribed to lessen discomfort. Treatments that are sometimes used if initial treatments do not work well or if the condition is more severe may include: Botulinum injection. Surgery to repair the fissure. Follow these instructions at home: Eating and drinking  Avoid foods that may cause  constipation, such as bananas, milk, and other dairy products. Eat all fruits, except bananas. Drink enough fluid to keep your urine pale yellow. Eat foods that are high in fiber, such as beans, whole grains, and fresh fruits and vegetables. General instructions  Take over-the-counter and prescription medicines only as told by your health care provider. Use creams or ointments only as told by your health care provider. Keep the anal area clean and dry. Take sitz baths as told by your health care provider. Do not use soap in the sitz baths. Keep all follow-up visits as told by your health care provider. This is important. Contact a health care provider if you have: More bleeding. A fever. Diarrhea that is mixed with blood. Pain that continues. Ongoing problems that are getting worse rather than better. Summary An anal fissure is a small tear or crack in the tissue of the anus. This condition is usually caused by passing a large or hard stool (feces). Other causes include constipation and frequent diarrhea. Initial treatment for this condition may include taking steps to avoid constipation, such as increasing your intake of fiber or fluid. Follow instructions for care as told by your health care provider. Contact your health care provider if you have more bleeding or your problem is getting worse rather than better. Keep all follow-up visits as told by your health care provider. This is important. This information is not intended to replace advice given to you by your health care provider. Make sure you discuss any questions you have with your health care provider. Document Revised: 12/31/2020 Document Reviewed: 12/31/2020 Elsevier Patient Education  [place].

## 2022-01-13 ENCOUNTER — Encounter: Payer: Self-pay | Admitting: Family Medicine

## 2022-01-14 ENCOUNTER — Ambulatory Visit: Payer: Managed Care, Other (non HMO) | Admitting: Family Medicine

## 2022-01-14 ENCOUNTER — Encounter: Payer: Self-pay | Admitting: Family Medicine

## 2022-01-14 VITALS — BP 116/66 | HR 75 | Temp 97.2°F | Ht 66.0 in | Wt 137.6 lb

## 2022-01-14 DIAGNOSIS — K602 Anal fissure, unspecified: Secondary | ICD-10-CM

## 2022-01-14 MED ORDER — HYDROCORTISONE (PERIANAL) 2.5 % EX CREA: 1.0000 | TOPICAL_CREAM | Freq: Two times a day (BID) | CUTANEOUS | 0 refills | Status: DC

## 2022-01-14 NOTE — Progress Notes (Signed)
[facility] [address] [place] [address] Dept: [phone] Dept Fax: [phone]  Office Visit  Subjective:    Patient ID: Andrea Beltran, female    DOB: 22-Apr-1986, 36 y.o..   MRN: [id number]  Chief Complaint  Patient presents with   Follow-up    F/u anal fissure not any better.  Still having itching and medication causing HA/dizziness.     History of Present Illness:  I had seen Andrea Beltran on 12/24/2021 with an anal fissure. She was prescribed nitroglycerin ointment. She feels the fissure is improving, but notes that each time she applies the ointment, she experiences headaches and dizziness.   Past Medical History: Patient Active Problem List   Diagnosis Date Noted   Hot flashes due to tamoxifen 11/09/2018   Malignant neoplasm of upper-outer quadrant of left breast in female, estrogen receptor positive ([place]) 07/30/2018   Non-seasonal allergic rhinitis 02/10/2017   Insomnia 02/10/2017   Vitamin D deficiency 02/10/2017   Migraine without status migrainosus, not intractable 02/10/2017   Past Surgical History:  Procedure Laterality Date   BREAST LUMPECTOMY Left 2019   Family History  Problem Relation Age of Onset   Hypertension Mother    Stroke Father    Cancer Paternal Uncle        Brain   Breast cancer Maternal Grandmother    Cancer Maternal Grandmother        Breast, skin, kidney   Diabetes Paternal Grandfather    Outpatient Medications Prior to Visit  Medication Sig Dispense Refill   D3-50 1.25 MG (50000 UT) capsule Take 50,000 Units by mouth once a week.     eszopiclone (LUNESTA) 2 MG TABS tablet TAKE 1 TABLET(2 MG) BY MOUTH AT BEDTIME 90 tablet 1   fluticasone (FLONASE) 50 MCG/ACT nasal spray Place 2 sprays into both nostrils daily. 16 g 6   loratadine (CLARITIN) 10 MG tablet Take 10 mg by mouth daily.     montelukast (SINGULAIR) 10 MG tablet Take 1 tablet (10 mg total) by mouth at bedtime. 30 tablet 3    Nitroglycerin 0.4 % OINT Apply 1 inch (375 mg) ointment intra-anally every 12 hours for anal fissure 30 g 1   tamoxifen (NOLVADEX) 10 MG tablet Once daily 90 tablet 3   zolpidem (AMBIEN) 5 MG tablet Take 1 tablet (5 mg total) by mouth at bedtime as needed for sleep. 5 tablet 0   No facility-administered medications prior to visit.   Allergies  Allergen Reactions   Cat Hair Extract Other (See Comments)    sneezing sneezing    Pollen Extract Other (See Comments)    Sneezing      Objective:   Today's Vitals   01/14/22 1310  BP: 116/66  Pulse: 75  Temp: (!) 97.2 F (36.2 C)  TempSrc: Temporal  SpO2: 98%  Weight: 137 lb 9.6 oz (62.4 kg)  Height: '5\' 6"'$  (1.676 m)   Body mass index is 22.21 kg/m.   General: Well developed, well nourished. No acute distress. Psych: Alert and oriented. Normal mood and affect.  Health Maintenance Due  Topic Date Due   Hepatitis C Screening  Never done     Assessment & Plan:   1. Anal fissure Headache and dizziness would be common adverse reactions associated with nitroglycerin use. We will switch her to a steroid cream instead, as she is also having some perianal itching. If not improving, would consider a referral to a proctologist.  - hydrocortisone (ANUSOL-HC)  2.5 % rectal cream; Place 1 Application rectally 2 (two) times daily.  Dispense: 30 g; Refill: 0   Return if symptoms worsen or fail to improve.   Haydee Salter, MD

## 2022-01-17 ENCOUNTER — Encounter: Payer: Self-pay | Admitting: Family Medicine

## 2022-01-26 ENCOUNTER — Encounter (INDEPENDENT_AMBULATORY_CARE_PROVIDER_SITE_OTHER): Payer: Self-pay

## 2022-03-17 ENCOUNTER — Other Ambulatory Visit: Payer: Self-pay | Admitting: Hematology and Oncology

## 2022-03-17 DIAGNOSIS — Z170 Estrogen receptor positive status [ER+]: Secondary | ICD-10-CM

## 2022-03-25 ENCOUNTER — Other Ambulatory Visit: Payer: Self-pay | Admitting: Hematology and Oncology

## 2022-03-25 DIAGNOSIS — Z1231 Encounter for screening mammogram for malignant neoplasm of breast: Secondary | ICD-10-CM

## 2022-04-19 ENCOUNTER — Other Ambulatory Visit: Payer: Self-pay | Admitting: Family Medicine

## 2022-04-19 DIAGNOSIS — G4701 Insomnia due to medical condition: Secondary | ICD-10-CM

## 2022-04-19 NOTE — Telephone Encounter (Signed)
Refill request for  Lunesta 2 mg LR 11/09/21, #90, 1 rf LOV 12/07/21 FOV  none scheduled.   Please review and advise.  Thanks. Dm/cma

## 2022-04-22 ENCOUNTER — Encounter: Payer: Self-pay | Admitting: Family Medicine

## 2022-04-28 ENCOUNTER — Ambulatory Visit: Payer: Managed Care, Other (non HMO)

## 2022-04-28 ENCOUNTER — Other Ambulatory Visit: Payer: Self-pay | Admitting: Family Medicine

## 2022-04-28 DIAGNOSIS — J3089 Other allergic rhinitis: Secondary | ICD-10-CM

## 2022-05-30 ENCOUNTER — Other Ambulatory Visit: Payer: Self-pay

## 2022-05-30 ENCOUNTER — Encounter: Payer: Self-pay | Admitting: Hematology and Oncology

## 2022-05-30 DIAGNOSIS — Z170 Estrogen receptor positive status [ER+]: Secondary | ICD-10-CM

## 2022-05-30 MED ORDER — TAMOXIFEN CITRATE 10 MG PO TABS: ORAL_TABLET | ORAL | 3 refills | Status: DC

## 2022-07-27 ENCOUNTER — Ambulatory Visit
Admission: RE | Admit: 2022-07-27 | Discharge: 2022-07-27 | Disposition: A | Discharge status: Home / Self Care | Payer: Managed Care, Other (non HMO) | Source: Ambulatory Visit | Attending: Hematology and Oncology | Admitting: Hematology and Oncology

## 2022-07-27 DIAGNOSIS — Z1231 Encounter for screening mammogram for malignant neoplasm of breast: Secondary | ICD-10-CM

## 2022-08-05 NOTE — Progress Notes (Signed)
Patient Care Team: Haydee Salter, MD as PCP - General (Family Medicine) Nicholas Lose, MD as Consulting Physician (Hematology and Oncology)  DIAGNOSIS: No diagnosis found.  SUMMARY OF ONCOLOGIC HISTORY: Oncology History  Malignant neoplasm of upper-outer quadrant of left breast in female, estrogen receptor positive ([place])  03/29/2018 Surgery   Left lumpectomy at [facility] [place]: IDC grade 2, 1.8 x 1.6 x 1 cm, lymphovascular invasion present focally, 0/2 lymph nodes negative, ER positive, PR positive, HER-2 negative IHC 0, T1CN0 stage Ia pathologic stage   04/23/2018 Oncotype testing   Recurrent score: 16, distant recurrence of 9 years: 4%   05/03/2018 - 05/30/2018 Radiation Therapy   Adjuvant radiation therapy in [place] (4256 cGy/16 boost 10 Gy/4)   06/04/2018 -  Anti-estrogen oral therapy   Adjuvant tamoxifen 20 mg daily     CHIEF COMPLIANT:   INTERVAL HISTORY: Andrea Beltran is a   ALLERGIES:  is allergic to cat hair extract and pollen extract.  MEDICATIONS:  Current Outpatient Medications  Medication Sig Dispense Refill   D3-50 1.25 MG (50000 UT) capsule Take 50,000 Units by mouth once a week.     eszopiclone (LUNESTA) 2 MG TABS tablet TAKE 1 TABLET(2 MG) BY MOUTH AT BEDTIME 90 tablet 1   fluticasone (FLONASE) 50 MCG/ACT nasal spray SHAKE LIQUID AND USE 2 SPRAYS IN EACH NOSTRIL DAILY 16 g 6   hydrocortisone (ANUSOL-HC) 2.5 % rectal cream Place 1 Application rectally 2 (two) times daily. 30 g 0   loratadine (CLARITIN) 10 MG tablet Take 10 mg by mouth daily.     montelukast (SINGULAIR) 10 MG tablet Take 1 tablet (10 mg total) by mouth at bedtime. 30 tablet 3   Nitroglycerin 0.4 % OINT Apply 1 inch (375 mg) ointment intra-anally every 12 hours for anal fissure 30 g 1   tamoxifen (NOLVADEX) 10 MG tablet TAKE 1 TABLET BY MOUTH EVERY DAY 90 tablet 3   zolpidem (AMBIEN) 5 MG tablet Take 1 tablet (5 mg total) by mouth at bedtime as needed for  sleep. 5 tablet 0   No current facility-administered medications for this visit.    PHYSICAL EXAMINATION: ECOG PERFORMANCE STATUS: {CHL ONC ECOG [id number]}  There were no vitals filed for this visit. There were no vitals filed for this visit.  BREAST:*** No palpable masses or nodules in either right or left breasts. No palpable axillary supraclavicular or infraclavicular adenopathy no breast tenderness or nipple discharge. (exam performed in the presence of a chaperone)  LABORATORY DATA:  I have reviewed the data as listed    Latest Ref Rng & Units 08/16/2021   11:58 AM 09/13/2019    2:18 PM 03/26/2019   11:28 AM  CMP  Glucose 70 - 99 mg/dL 79  97  96   BUN 6 - 23 mg/dL 11  12  10   $ Creatinine 0.40 - 1.20 mg/dL 0.60  0.64  0.64   Sodium 135 - 145 mEq/L 139  139  140   Potassium 3.5 - 5.1 mEq/L 3.9  3.8  3.9   Chloride 96 - 112 mEq/L 103  104  103   CO2 19 - 32 mEq/L 29  29  29   $ Calcium 8.4 - 10.5 mg/dL 9.5  9.4  9.7   Total Protein 6.0 - 8.3 g/dL 8.4  8.1  8.0   Total Bilirubin 0.2 - 1.2 mg/dL 0.5  0.4  0.4   Alkaline Phos 39 - 117 U/L 50  46  49   AST 0 - 37 U/L 28  23  21   $ ALT 0 - 35 U/L 18  14  14     $ Lab Results  Component Value Date   WBC 4.4 08/16/2021   HGB 12.7 08/16/2021   HCT 38.9 08/16/2021   MCV 91.1 08/16/2021   PLT 207.0 08/16/2021   NEUTROABS 1.7 09/13/2019    ASSESSMENT & PLAN:  No problem-specific Assessment & Plan notes found for this encounter.    No orders of the defined types were placed in this encounter.  The patient has a good understanding of the overall plan. she agrees with it. she will call with any problems that may develop before the next visit here. Total time spent: 30 mins including face to face time and time spent for planning, charting and co-ordination of care   Suzzette Righter, [place] 08/05/22    I Gardiner Coins am acting as a Education administrator for [employer]  ***

## 2022-08-08 ENCOUNTER — Inpatient Hospital Stay: Payer: Managed Care, Other (non HMO) | Attending: Hematology and Oncology | Admitting: Hematology and Oncology

## 2022-08-08 ENCOUNTER — Encounter: Payer: Self-pay | Admitting: Hematology and Oncology

## 2022-08-08 ENCOUNTER — Other Ambulatory Visit: Payer: Self-pay

## 2022-08-08 VITALS — BP 114/74 | HR 86 | Temp 97.8°F | Resp 16 | Ht 66.0 in | Wt 142.1 lb

## 2022-08-08 DIAGNOSIS — Z923 Personal history of irradiation: Secondary | ICD-10-CM | POA: Diagnosis not present

## 2022-08-08 DIAGNOSIS — Z79810 Long term (current) use of selective estrogen receptor modulators (SERMs): Secondary | ICD-10-CM | POA: Diagnosis not present

## 2022-08-08 DIAGNOSIS — Z170 Estrogen receptor positive status [ER+]: Secondary | ICD-10-CM | POA: Diagnosis not present

## 2022-08-08 DIAGNOSIS — C50412 Malignant neoplasm of upper-outer quadrant of left female breast: Secondary | ICD-10-CM | POA: Insufficient documentation

## 2022-08-08 MED ORDER — TAMOXIFEN CITRATE 10 MG PO TABS: 5.0000 mg | ORAL_TABLET | Freq: Every day | ORAL | 3 refills | Status: DC

## 2022-08-08 NOTE — Assessment & Plan Note (Addendum)
03/29/2018:Left lumpectomy at [facility] [place]: IDC grade 2, 1.8 x 1.6 x 1 cm, lymphovascular invasion present focally, 0/2 lymph nodes negative, ER positive, PR positive, HER-2 negative IHC 0, T1CN0 stage Ia pathologic stage Oncotype DX score 16: Risk of recurrence 4% Adjuvant radiation therapy 05/01/2018-05/30/2018   Current treatment: Adjuvant tamoxifen started 06/03/2018 (10 mg dose) reduced to 5 mg today on 08/08/2022  Tamoxifen toxicities: 1.  Hot flashes and night sweats 2.  Fatigue and hair loss 3.  Emotional mood swings Her menstrual cycle started back December 2023   She could not tolerate Zoladex because of the injection site discomfort.  Its been discontinued.   Breast cancer surveillance: 1. Mammogram 07/29/2022: Benign, breast density category D 2. Breast MRI 11/18/21: Right breast Mass 0.5 cm retro areolar mass: Biopsy: Benign   Her plan is to finish 5 years of tamoxifen and then have a child. We discussed about doing breast cancer index to determine if she would benefit from external endocrine therapy.  Because of her pathology being in [place] it is difficult to know what the process might be.  She has some family there and will work on trying to get more information.  RTC in 1 year

## 2022-10-10 ENCOUNTER — Other Ambulatory Visit: Payer: Self-pay | Admitting: Family Medicine

## 2022-10-10 DIAGNOSIS — G4701 Insomnia due to medical condition: Secondary | ICD-10-CM

## 2022-10-10 NOTE — Telephone Encounter (Signed)
Refill request for  Lunesta 2 mg LR  04/19/22, # 90,  1 rf LOV 01/14/22 FOV none scheduled.    Please review and advise. Thanks. Dm/cma

## 2022-10-11 ENCOUNTER — Encounter: Payer: Self-pay | Admitting: Hematology and Oncology

## 2022-10-22 ENCOUNTER — Encounter: Payer: Self-pay | Admitting: Hematology and Oncology

## 2022-10-25 ENCOUNTER — Encounter: Payer: Self-pay | Admitting: Hematology and Oncology

## 2022-10-29 ENCOUNTER — Ambulatory Visit
Admission: RE | Admit: 2022-10-29 | Discharge: 2022-10-29 | Disposition: A | Discharge status: Home / Self Care | Payer: Managed Care, Other (non HMO) | Source: Ambulatory Visit | Attending: Hematology and Oncology | Admitting: Hematology and Oncology

## 2022-10-29 DIAGNOSIS — C50412 Malignant neoplasm of upper-outer quadrant of left female breast: Secondary | ICD-10-CM

## 2022-10-29 MED ORDER — GADOPICLENOL 0.5 MMOL/ML IV SOLN
6.0000 mL | Freq: Once | INTRAVENOUS | Status: AC | PRN
  Administered 2022-10-29: 6 mL via INTRAVENOUS

## 2023-01-04 ENCOUNTER — Other Ambulatory Visit: Payer: Self-pay | Admitting: Family Medicine

## 2023-01-04 DIAGNOSIS — G4701 Insomnia due to medical condition: Secondary | ICD-10-CM

## 2023-01-12 ENCOUNTER — Encounter: Payer: Self-pay | Admitting: Hematology and Oncology

## 2023-02-05 ENCOUNTER — Other Ambulatory Visit: Payer: Self-pay | Admitting: Family Medicine

## 2023-02-05 DIAGNOSIS — G4701 Insomnia due to medical condition: Secondary | ICD-10-CM

## 2023-02-25 ENCOUNTER — Other Ambulatory Visit: Payer: Self-pay | Admitting: Hematology and Oncology

## 2023-02-25 DIAGNOSIS — Z170 Estrogen receptor positive status [ER+]: Secondary | ICD-10-CM

## 2023-03-06 ENCOUNTER — Other Ambulatory Visit: Payer: Self-pay | Admitting: Family Medicine

## 2023-03-06 DIAGNOSIS — G4701 Insomnia due to medical condition: Secondary | ICD-10-CM

## 2023-08-08 ENCOUNTER — Telehealth: Payer: Self-pay | Admitting: Hematology and Oncology

## 2023-08-08 NOTE — Telephone Encounter (Signed)
 Rescheduled appointment per patients request due to being sick. Patient is aware of the changes made to her upcoming appointment.

## 2023-08-10 ENCOUNTER — Ambulatory Visit: Payer: Managed Care, Other (non HMO) | Admitting: Hematology and Oncology

## 2023-08-24 ENCOUNTER — Inpatient Hospital Stay: Payer: Managed Care, Other (non HMO) | Attending: Hematology and Oncology | Admitting: Hematology and Oncology

## 2023-08-24 VITALS — BP 116/68 | HR 83 | Temp 98.1°F | Resp 16 | Ht 66.0 in | Wt 144.5 lb

## 2023-08-24 DIAGNOSIS — Z170 Estrogen receptor positive status [ER+]: Secondary | ICD-10-CM | POA: Insufficient documentation

## 2023-08-24 DIAGNOSIS — Z1721 Progesterone receptor positive status: Secondary | ICD-10-CM | POA: Diagnosis not present

## 2023-08-24 DIAGNOSIS — Z79899 Other long term (current) drug therapy: Secondary | ICD-10-CM | POA: Insufficient documentation

## 2023-08-24 DIAGNOSIS — Z1732 Human epidermal growth factor receptor 2 negative status: Secondary | ICD-10-CM | POA: Diagnosis not present

## 2023-08-24 DIAGNOSIS — C50412 Malignant neoplasm of upper-outer quadrant of left female breast: Secondary | ICD-10-CM | POA: Diagnosis present

## 2023-08-24 NOTE — Assessment & Plan Note (Signed)
 03/29/2018:Left lumpectomy at [facility] [place]: IDC grade 2, 1.8 x 1.6 x 1 cm, lymphovascular invasion present focally, 0/2 lymph nodes negative, ER positive, PR positive, HER-2 negative IHC 0, T1CN0 stage Ia pathologic stage Oncotype DX score 16: Risk of recurrence 4% Adjuvant radiation therapy 05/01/2018-05/30/2018   Current treatment: Adjuvant tamoxifen started 06/03/2018 (10 mg dose) reduced to 5 mg today on 08/08/2022   Tamoxifen toxicities: 1.  Hot flashes and night sweats 2.  Fatigue and hair loss 3.  Emotional mood swings Her menstrual cycle started back December 2023   She could not tolerate Zoladex because of the injection site discomfort.  Its been discontinued.   Breast cancer surveillance: 1. Mammogram 07/29/2022: Benign, breast density category D 2. Breast MRI 10/31/2022: Benign breast density category D   Her plan is to finish 5 years of tamoxifen and then have a child. We discussed about doing breast cancer index to determine if she would benefit from external endocrine therapy.  Because of her pathology being in [place] it is difficult to know what the process might be.  She has some family there and will work on trying to get more information.   RTC in 1 year

## 2023-08-24 NOTE — Progress Notes (Signed)
 Patient Care Team: Loyola Mast, MD as PCP - General (Family Medicine) Serena Croissant, MD as Consulting Physician (Hematology and Oncology)  DIAGNOSIS:  Encounter Diagnosis  Name Primary?   Malignant neoplasm of upper-outer quadrant of left breast in female, estrogen receptor positive ([facility]) Yes    SUMMARY OF ONCOLOGIC HISTORY: Oncology History  Malignant neoplasm of upper-outer quadrant of left breast in female, estrogen receptor positive ([facility])  03/29/2018 Surgery   Left lumpectomy at [facility] [place]: IDC grade 2, 1.8 x 1.6 x 1 cm, lymphovascular invasion present focally, 0/2 lymph nodes negative, ER positive, PR positive, HER-2 negative IHC 0, T1CN0 stage Ia pathologic stage   04/23/2018 Oncotype testing   Recurrent score: 16, distant recurrence of 9 years: 4%   05/03/2018 - 05/30/2018 Radiation Therapy   Adjuvant radiation therapy in [place] (4256 cGy/16 boost 10 Gy/4)   06/04/2018 -  Anti-estrogen oral therapy   Adjuvant tamoxifen 20 mg daily then 10 mg and then 5 mg starting 08/08/22     CHIEF COMPLIANT: Follow-up on tamoxifen  HISTORY OF PRESENT ILLNESS:   History of Present Illness The patient, with a history of breast cancer, presents for a follow-up after stopping tamoxifen. She completed a five-year course of tamoxifen and decided to take a break. She reports that her menstrual cycle is slowly returning after stopping the medication. She had her first period four days after stopping tamoxifen and then had another period recently. She is considering pregnancy and plans to try naturally. She is aware of the need to wait approximately six months before trying to conceive.  The patient also mentions a family member with a medical background who suggested she should have a genetic test before making any decisions about having children. She initially declined the test due to cost but now expresses interest in having it done. She plans to inquire  about the possibility of having the test done during an upcoming trip to [place].  The patient also discusses her perspective on having children, mentioning that she used to want children but now feels less inclined. She cites the demands of raising children and the impact on personal freedom as factors influencing her current feelings.     ALLERGIES:  is allergic to cat dander and pollen extract.  MEDICATIONS:  Current Outpatient Medications  Medication Sig Dispense Refill   cetirizine (ZYRTEC) 10 MG chewable tablet Chew 10 mg by mouth.     D3-50 1.25 MG (50000 UT) capsule Take 50,000 Units by mouth once a week.     eszopiclone (LUNESTA) 2 MG TABS tablet TAKE 1 TABLET(2 MG) BY MOUTH AT BEDTIME AS NEEDED FOR SLEEP 30 tablet 5   fluticasone (FLONASE) 50 MCG/ACT nasal spray SHAKE LIQUID AND USE 2 SPRAYS IN EACH NOSTRIL DAILY 16 g 6   No current facility-administered medications for this visit.    PHYSICAL EXAMINATION: ECOG PERFORMANCE STATUS: 1 - Symptomatic but completely ambulatory  Vitals:   08/24/23 1431  BP: 116/68  Pulse: 83  Resp: 16  Temp: 98.1 F (36.7 C)  SpO2: 100%   Filed Weights   08/24/23 1431  Weight: 144 lb 8 oz (65.5 kg)      LABORATORY DATA:  I have reviewed the data as listed    Latest Ref Rng & Units 08/16/2021   11:58 AM 09/13/2019    2:18 PM 03/26/2019   11:28 AM  CMP  Glucose 70 - 99 mg/dL 79  97  96   BUN  6 - 23 mg/dL 11  12  10    Creatinine 0.40 - 1.20 mg/dL 1.61  0.96  0.45   Sodium 135 - 145 mEq/L 139  139  140   Potassium 3.5 - 5.1 mEq/L 3.9  3.8  3.9   Chloride 96 - 112 mEq/L 103  104  103   CO2 19 - 32 mEq/L 29  29  29    Calcium 8.4 - 10.5 mg/dL 9.5  9.4  9.7   Total Protein 6.0 - 8.3 g/dL 8.4  8.1  8.0   Total Bilirubin 0.2 - 1.2 mg/dL 0.5  0.4  0.4   Alkaline Phos 39 - 117 U/L 50  46  49   AST 0 - 37 U/L 28  23  21    ALT 0 - 35 U/L 18  14  14      Lab Results  Component Value Date   WBC 4.4 08/16/2021   HGB 12.7 08/16/2021   HCT  38.9 08/16/2021   MCV 91.1 08/16/2021   PLT 207.0 08/16/2021   NEUTROABS 1.7 09/13/2019    ASSESSMENT & PLAN:  Malignant neoplasm of upper-outer quadrant of left breast in female, estrogen receptor positive ([facility]) 03/29/2018:Left lumpectomy at [facility] [place]: IDC grade 2, 1.8 x 1.6 x 1 cm, lymphovascular invasion present focally, 0/2 lymph nodes negative, ER positive, PR positive, HER-2 negative IHC 0, T1CN0 stage Ia pathologic stage Oncotype DX score 16: Risk of recurrence 4% Adjuvant radiation therapy 05/01/2018-05/30/2018   Current treatment: Adjuvant tamoxifen started 06/03/2018 (10 mg dose) reduced to 5 mg today on 08/08/2022 discontinued December 2024   Tamoxifen toxicities: 1.  Hot flashes and night sweats 2.  Fatigue and hair loss 3.  Emotional mood swings Her menstrual cycle started back December 2023   She could not tolerate Zoladex because of the injection site discomfort.  Its been discontinued.   Breast cancer surveillance: 1. Mammogram 07/29/2022: Benign, breast density category D 2. Breast MRI 10/31/2022: Benign breast density category D   She will check in [place] to see if breast cancer index can be done to determine if she would benefit from external endocrine therapy. We will continue to alternate mammograms with breast MRIs. She plans to get pregnant at some point.  We will plan to resume tamoxifen after she completes childbearing.  This may take 1 to 2 years.   RTC in 1 year  ------------------------------------- Assessment and Plan Assessment & Plan Breast Cancer Surveillance Completed five years of tamoxifen. Menstrual cycle returning. Ten years of tamoxifen shown superior. Breast Cancer Index test unavailable in [place]. Guardant Reveal test recommended for early recurrence detection. - Provided brochure on Breast Cancer Index test. - Recommend Guardant Reveal blood test biannually. - Schedule follow-up in two years for tamoxifen  discussion.  Breast Cancer Imaging Surveillance Annual mammograms automated. MRI requires insurance approval and scheduling. - Order annual breast MRI. - Ensure insurance approval for MRI.  Genetic Testing for Breast Cancer She wishes to proceed with genetic testing due to family advice and potential pregnancy plans. Genetic counseling recommended. - Schedule appointment with genetic counselor. - Order genetic testing.      Orders Placed This Encounter  Procedures   MR BREAST BILATERAL W WO CONTRAST INC CAD    Standing Status:   Future    Expected Date:   11/01/2023    Expiration Date:   08/23/2024    If indicated for the ordered procedure, I authorize the administration of contrast media  per Radiology protocol:   Yes    What is the patient's sedation requirement?:   No Sedation    Does the patient have a pacemaker or implanted devices?:   No    Preferred imaging location?:   [facility] (table limit-550lbs)    Release to patient:   Immediate   The patient has a good understanding of the overall plan. she agrees with it. she will call with any problems that may develop before the next visit here. Total time spent: 30 mins including face to face time and time spent for planning, charting and co-ordination of care   Tamsen Meek, MD 08/24/23

## 2023-08-28 ENCOUNTER — Telehealth: Payer: Self-pay

## 2023-08-29 ENCOUNTER — Encounter: Payer: Self-pay | Admitting: Hematology and Oncology

## 2023-08-29 NOTE — Telephone Encounter (Signed)
 Per md orders entered for Guardant Reveal and all supported documents faxed to [phone]. Faxed confirmation was received.

## 2023-08-31 ENCOUNTER — Encounter: Payer: Self-pay | Admitting: Family Medicine

## 2023-08-31 ENCOUNTER — Other Ambulatory Visit: Payer: Self-pay | Admitting: Family Medicine

## 2023-08-31 DIAGNOSIS — G4701 Insomnia due to medical condition: Secondary | ICD-10-CM

## 2023-09-04 ENCOUNTER — Other Ambulatory Visit: Payer: Self-pay | Admitting: Hematology and Oncology

## 2023-09-04 DIAGNOSIS — Z1231 Encounter for screening mammogram for malignant neoplasm of breast: Secondary | ICD-10-CM

## 2023-09-05 ENCOUNTER — Encounter: Payer: Self-pay | Admitting: Hematology and Oncology

## 2023-09-05 ENCOUNTER — Ambulatory Visit
Admission: RE | Admit: 2023-09-05 | Discharge: 2023-09-05 | Disposition: A | Discharge status: Home / Self Care | Source: Ambulatory Visit | Attending: Hematology and Oncology | Admitting: Hematology and Oncology

## 2023-09-05 DIAGNOSIS — Z1231 Encounter for screening mammogram for malignant neoplasm of breast: Secondary | ICD-10-CM

## 2023-09-08 ENCOUNTER — Ambulatory Visit: Admitting: Family Medicine

## 2023-09-08 ENCOUNTER — Encounter: Payer: Self-pay | Admitting: Family Medicine

## 2023-09-08 VITALS — BP 110/66 | HR 84 | Temp 98.2°F | Ht 66.0 in

## 2023-09-08 DIAGNOSIS — J3089 Other allergic rhinitis: Secondary | ICD-10-CM | POA: Diagnosis not present

## 2023-09-08 DIAGNOSIS — G4701 Insomnia due to medical condition: Secondary | ICD-10-CM | POA: Diagnosis not present

## 2023-09-08 DIAGNOSIS — E282 Polycystic ovarian syndrome: Secondary | ICD-10-CM

## 2023-09-08 DIAGNOSIS — Z1159 Encounter for screening for other viral diseases: Secondary | ICD-10-CM

## 2023-09-08 DIAGNOSIS — Z13220 Encounter for screening for lipoid disorders: Secondary | ICD-10-CM

## 2023-09-08 DIAGNOSIS — Z853 Personal history of malignant neoplasm of breast: Secondary | ICD-10-CM

## 2023-09-08 LAB — BASIC METABOLIC PANEL
BUN: 11 mg/dL (ref 6–23)
CO2: 30 meq/L (ref 19–32)
Calcium: 9.2 mg/dL (ref 8.4–10.5)
Chloride: 103 meq/L (ref 96–112)
Creatinine, Ser: 0.54 mg/dL (ref 0.40–1.20)
GFR: 117.85 mL/min (ref >60.00)
Glucose, Bld: 102 mg/dL — ABNORMAL HIGH (ref 70–99)
Potassium: 3.8 meq/L (ref 3.5–5.1)
Sodium: 139 meq/L (ref 135–145)

## 2023-09-08 LAB — LIPID PANEL
Cholesterol: 175 mg/dL (ref 0–200)
HDL: 63.2 mg/dL (ref >39.00)
LDL Cholesterol: 95 mg/dL (ref 0–99)
NonHDL: 111.52
Total CHOL/HDL Ratio: 3
Triglycerides: 83 mg/dL (ref 0.0–149.0)
VLDL: 16.6 mg/dL (ref 0.0–40.0)

## 2023-09-08 MED ORDER — ESZOPICLONE 2 MG PO TABS
2.0000 mg | ORAL_TABLET | Freq: Every evening | ORAL | 5 refills | Status: DC | PRN
Start: 2023-09-08 — End: 2024-03-20

## 2023-09-08 MED ORDER — FLUTICASONE PROPIONATE 50 MCG/ACT NA SUSP: 1.0000 | Freq: Every day | NASAL | 6 refills | Status: AC

## 2023-09-08 MED ORDER — MONTELUKAST SODIUM 10 MG PO TABS: 10.0000 mg | ORAL_TABLET | Freq: Every day | ORAL | 3 refills | Status: AC

## 2023-09-08 NOTE — Assessment & Plan Note (Signed)
 Continue to manage with cetirizine 10 mg daily, fluticasone nasal spray 2 sprays in each nostril daily, and montelukast 10 mg daily.

## 2023-09-08 NOTE — Assessment & Plan Note (Signed)
 We will continue Lunesta 2 mg at bedtime for now.

## 2023-09-08 NOTE — Progress Notes (Signed)
 [facility] [address] [place] [address] Dept: [phone] Dept Fax: [phone]  Chronic Care Office Visit  Subjective:    Patient ID: Andrea Beltran, female    DOB: January 18, 1986, 38 y.o..   MRN: [id number]  Chief Complaint  Patient presents with   Insomnia    F/u insomnia.     History of Present Illness:  Patient is in today for reassessment of chronic medical issues.  Andrea Beltran was diagnosed with left breast cancer in 2019 in [place]. She underwent lumpectomy followed by radiation. Her cancer is ER+, PR+, and HER2-. She is now off of tamoxifen. She is noting some of the symptoms related ot this to be slowly resolving. She had been having some prolonged bleeding with her menses. Her GYN has diagnosed polycystic ovarian syndrome. She is scheduled for an ultrasound next week.   Andrea Beltran has a history of allergic rhinitis, with allergies to pollen and cat dander. She uses periodic Claritin, Flonase spray,and montelukast to control symptoms.    Andrea Beltran has a history of insomnia secondary to her tamoxifen use. She has noted this sisue has been lingering though she is now off of her tamoxifen. She is currently managed on Lunesta.   Past Medical History: Patient Active Problem List   Diagnosis Date Noted   History of malignant neoplasm of breast 02/28/2019   Hot flashes due to tamoxifen 11/09/2018   Malignant neoplasm of upper-outer quadrant of left breast in female, estrogen receptor positive ([facility]) 07/30/2018   Polycystic ovarian syndrome 10/03/2017   Irregular menstruation 03/08/2017   Non-seasonal allergic rhinitis 02/10/2017   Insomnia 02/10/2017   Vitamin D deficiency 02/10/2017   Migraine without status migrainosus, not intractable 02/10/2017   Past Surgical History:  Procedure Laterality Date   BREAST BIOPSY Right 11/2021   BREAST LUMPECTOMY Left 2019   Family History  Problem Relation Age of Onset    Hypertension Mother    Stroke Father    Cancer Paternal Uncle        Brain   Breast cancer Maternal Grandmother    Cancer Maternal Grandmother        Breast, skin, kidney   Diabetes Paternal Grandfather    Outpatient Medications Prior to Visit  Medication Sig Dispense Refill   cetirizine (ZYRTEC) 10 MG chewable tablet Chew 10 mg by mouth.     D3-50 1.25 MG (50000 UT) capsule Take 50,000 Units by mouth once a week.     eszopiclone (LUNESTA) 2 MG TABS tablet TAKE 1 TABLET(2 MG) BY MOUTH AT BEDTIME AS NEEDED FOR SLEEP 30 tablet 5   fluticasone (FLONASE) 50 MCG/ACT nasal spray SHAKE LIQUID AND USE 2 SPRAYS IN EACH NOSTRIL DAILY 16 g 6   montelukast (SINGULAIR) 10 MG tablet Take 10 mg by mouth at bedtime.     No facility-administered medications prior to visit.   Allergies  Allergen Reactions   Cat Dander Other (See Comments)    sneezing sneezing    Pollen Extract Other (See Comments)    Sneezing    Objective:   Today's Vitals   09/08/23 1053  BP: 110/66  Pulse: 84  Temp: 98.2 F (36.8 C)  TempSrc: Temporal  SpO2: 99%  Height: 5\' 6"  (1.676 m)   Body mass index is 23.32 kg/m.   General: Well developed, well nourished. No acute distress. Psych: Alert and oriented. Normal mood and affect.  Health Maintenance Due  Topic Date Due   Hepatitis C Screening  Never done   Cervical Cancer Screening (HPV/Pap Cotest)  Never done     Assessment & Plan:   Problem List Items Addressed This Visit       Respiratory   Non-seasonal allergic rhinitis   Continue to manage with cetirizine 10 mg daily, fluticasone nasal spray 2 sprays in each nostril daily, and montelukast 10 mg daily.      Relevant Medications   montelukast (SINGULAIR) 10 MG tablet   fluticasone (FLONASE) 50 MCG/ACT nasal spray     Endocrine   Polycystic ovarian syndrome   Working with GYN. Ultrasound pending. I will check blood glucose.      Relevant Orders   Basic metabolic panel (Completed)     Other    History of malignant neoplasm of breast   Continue surveillance with oncology.      Insomnia - Primary   We will continue Lunesta 2 mg at bedtime for now.      Relevant Medications   eszopiclone (LUNESTA) 2 MG TABS tablet   Other Visit Diagnoses       Screening for lipid disorders       Relevant Orders   Lipid panel (Completed)     Encounter for hepatitis C screening test for low risk patient       Relevant Orders   HCV Ab w Reflex to Quant PCR       Return for Annual preventative care.   Loyola Mast, MD

## 2023-09-08 NOTE — Assessment & Plan Note (Signed)
 Working with GYN. Ultrasound pending. I will check blood glucose.

## 2023-09-08 NOTE — Assessment & Plan Note (Signed)
Continue surveillance with oncology

## 2023-09-09 LAB — HCV INTERPRETATION

## 2023-09-09 LAB — HCV AB W REFLEX TO QUANT PCR: HCV Ab: NONREACTIVE

## 2023-11-06 ENCOUNTER — Other Ambulatory Visit

## 2023-11-06 ENCOUNTER — Encounter: Admitting: Genetic Counselor

## 2023-11-28 ENCOUNTER — Encounter: Payer: Self-pay | Admitting: Hematology and Oncology

## 2024-01-03 ENCOUNTER — Encounter: Payer: Self-pay | Admitting: Hematology and Oncology

## 2024-01-07 ENCOUNTER — Ambulatory Visit
Admission: RE | Admit: 2024-01-07 | Discharge: 2024-01-07 | Disposition: A | Discharge status: Home / Self Care | Source: Ambulatory Visit | Attending: Hematology and Oncology

## 2024-01-07 DIAGNOSIS — Z170 Estrogen receptor positive status [ER+]: Secondary | ICD-10-CM

## 2024-01-07 MED ORDER — GADOPICLENOL 0.5 MMOL/ML IV SOLN
6.0000 mL | Freq: Once | INTRAVENOUS | Status: AC | PRN
  Administered 2024-01-07: 6 mL via INTRAVENOUS

## 2024-01-08 ENCOUNTER — Ambulatory Visit: Payer: Self-pay | Admitting: Hematology and Oncology

## 2024-01-08 NOTE — Telephone Encounter (Signed)
Informed the patient that the breast MRI is normal

## 2024-03-20 ENCOUNTER — Other Ambulatory Visit: Payer: Self-pay | Admitting: Family Medicine

## 2024-03-20 DIAGNOSIS — G4701 Insomnia due to medical condition: Secondary | ICD-10-CM

## 2024-05-28 ENCOUNTER — Ambulatory Visit: Admitting: Family Medicine

## 2024-05-28 ENCOUNTER — Encounter: Payer: Self-pay | Admitting: Family Medicine

## 2024-05-28 VITALS — BP 116/74 | HR 90 | Temp 98.2°F | Ht 66.0 in | Wt 142.6 lb

## 2024-05-28 DIAGNOSIS — D649 Anemia, unspecified: Secondary | ICD-10-CM | POA: Insufficient documentation

## 2024-05-28 NOTE — Assessment & Plan Note (Signed)
 Andrea Beltran has been found to have moderate anemia. I will repeat her CBC. I will also check iron, B12, and folate levels. Further management will be based on the results of these tests.

## 2024-05-28 NOTE — Progress Notes (Signed)
 [facility] [address] [place] [address] Dept: [phone] Dept Fax: [phone]  Office Visit  Subjective:    Patient ID: Andrea Beltran, female    DOB: Feb 14, 1986, 38 y.o..   MRN: [id number]  Chief Complaint  Patient presents with   Follow-up    Follow to discuss anemia.  Had blood work out of country in October .   History of Present Illness:  Patient is in today noting she was recently found to have anemia. Andrea Beltran notes she has traveled back to [place] twice this year. The most recent was due to the sudden death of her father. While in [place], she was seen by her doctors and inadvertently found to have a hemoglobin in the 9 range. Due to her family situation, she was not able to find out more about the underlying cause. She has been eating vegetarian for a while and was concerned that might be related. She has resumed consuming other protein sources other than tofu, including beef and pork. She went off of tamoxifen  earlier this year. she has felt like her menstrual bleeding has become heavier. She does not take any vitamin supplementation.She has noted occasional dizziness when standing.  Past Medical History: Patient Active Problem List   Diagnosis Date Noted   History of malignant neoplasm of breast 02/28/2019   Hot flashes due to tamoxifen  11/09/2018   Malignant neoplasm of upper-outer quadrant of left breast in female, estrogen receptor positive (HCC) 07/30/2018   Polycystic ovarian syndrome 10/03/2017   Irregular menstruation 03/08/2017   Non-seasonal allergic rhinitis 02/10/2017   Insomnia 02/10/2017   Vitamin D deficiency 02/10/2017   Migraine without status migrainosus, not intractable 02/10/2017   Past Surgical History:  Procedure Laterality Date   BREAST BIOPSY Right 11/2021   BREAST LUMPECTOMY Left 2019   Family History  Problem Relation Age of Onset   Hypertension Mother    Stroke Father     Cancer Paternal Uncle        Brain   Breast cancer Maternal Grandmother    Cancer Maternal Grandmother        Breast, skin, kidney   Diabetes Paternal Grandfather    Outpatient Medications Prior to Visit  Medication Sig Dispense Refill   cetirizine (ZYRTEC) 10 MG chewable tablet Chew 10 mg by mouth.     D3-50 1.25 MG (50000 UT) capsule Take 50,000 Units by mouth once a week.     eszopiclone  (LUNESTA ) 2 MG TABS tablet TAKE 1 TABLET(2 MG) BY MOUTH IMMEDIATELY BEFORE BEDTIME AS NEEDED FOR SLEEP 30 tablet 5   fluticasone  (FLONASE ) 50 MCG/ACT nasal spray Place 1 spray into both nostrils daily. 16 g 6   montelukast  (SINGULAIR ) 10 MG tablet Take 1 tablet (10 mg total) by mouth at bedtime. 90 tablet 3   No facility-administered medications prior to visit.   Allergies  Allergen Reactions   Cat Dander Other (See Comments)    sneezing sneezing    Pollen Extract Other (See Comments)    Sneezing      Objective:   Today's Vitals   05/28/24 1416  BP: 116/74  Pulse: 90  Temp: 98.2 F (36.8 C)  TempSrc: Temporal  SpO2: 100%  Weight: 142 lb 9.6 oz (64.7 kg)  Height: 5' 6 (1.676 m)   Body mass index is 23.02 kg/m.   General: Well developed, well nourished. No acute distress. Psych: Alert and oriented. Normal mood and affect.  Health Maintenance Due  Topic  Date Due   Hepatitis B Vaccines 19-59 Average Risk (1 of 3 - 19+ 3-dose series) Never done   HPV VACCINES (1 - Risk 3-dose SCDM series) Never done   Cervical Cancer Screening (HPV/Pap Cotest)  Never done   Influenza Vaccine  01/19/2024     Assessment & Plan:   Problem List Items Addressed This Visit       Other   Anemia - Primary   Andrea Beltran has been found to have moderate anemia. I will repeat her CBC. I will also check iron, B12, and folate levels. Further management will be based on the results of these tests.      Relevant Orders   CBC   Iron, TIBC and Ferritin Panel   Folate   Vitamin B12    Return for  Follow-up after testing/imaging.    Garnette CHRISTELLA Simpler, MD  I,Emily Lagle,acting as a scribe for Garnette CHRISTELLA Simpler, MD.,have documented all relevant documentation on the behalf of Garnette CHRISTELLA Simpler, MD.  I, Garnette CHRISTELLA Simpler, MD, have reviewed all documentation for this visit. The documentation on 05/28/2024 for the exam, diagnosis, procedures, and orders are all accurate and complete.

## 2024-05-29 ENCOUNTER — Ambulatory Visit: Payer: Self-pay | Admitting: Family Medicine

## 2024-05-29 DIAGNOSIS — D500 Iron deficiency anemia secondary to blood loss (chronic): Secondary | ICD-10-CM | POA: Insufficient documentation

## 2024-05-29 LAB — CBC
HCT: 31.9 % — ABNORMAL LOW (ref 36.0–46.0)
Hemoglobin: 10.1 g/dL — ABNORMAL LOW (ref 12.0–15.0)
MCHC: 31.7 g/dL (ref 30.0–36.0)
MCV: 70.9 fl — ABNORMAL LOW (ref 78.0–100.0)
Platelets: 298 K/uL (ref 150.0–400.0)
RBC: 4.49 Mil/uL (ref 3.87–5.11)
RDW: 16.6 % — ABNORMAL HIGH (ref 11.5–15.5)
WBC: 4.6 K/uL (ref 4.0–10.5)

## 2024-05-29 LAB — IRON,TIBC AND FERRITIN PANEL
%SAT: 6 % — ABNORMAL LOW (ref 16–45)
Ferritin: 6 ng/mL — ABNORMAL LOW (ref 16–154)
Iron: 26 ug/dL — ABNORMAL LOW (ref 40–190)
TIBC: 465 ug/dL — ABNORMAL HIGH (ref 250–450)

## 2024-05-29 LAB — FOLATE: Folate: >23.7 ng/mL (ref >5.9)

## 2024-05-29 LAB — VITAMIN B12: Vitamin B-12: 554 pg/mL (ref 211–911)

## 2024-05-29 MED ORDER — IRON (FERROUS SULFATE) 325 (65 FE) MG PO TABS: 325.0000 mg | ORAL_TABLET | Freq: Two times a day (BID) | ORAL | 0 refills | Status: AC

## 2024-08-26 ENCOUNTER — Ambulatory Visit: Admitting: Hematology and Oncology
